# Patient Record
Sex: Female | Born: 1940 | Race: Black or African American | Hispanic: No | Smoking: Never smoker
Health system: Southern US, Community
[De-identification: ages and names within clinical notes are randomized; demographics above are authoritative.]

## PROBLEM LIST (undated history)

## (undated) ENCOUNTER — Ambulatory Visit: Discharge status: Absent without leave | Payer: Self-pay

## (undated) DIAGNOSIS — E119 Type 2 diabetes mellitus without complications: Secondary | ICD-10-CM

## (undated) DIAGNOSIS — E785 Hyperlipidemia, unspecified: Secondary | ICD-10-CM

## (undated) DIAGNOSIS — K648 Other hemorrhoids: Secondary | ICD-10-CM

## (undated) DIAGNOSIS — E559 Vitamin D deficiency, unspecified: Secondary | ICD-10-CM

## (undated) HISTORY — DX: Other hemorrhoids: K64.8

## (undated) HISTORY — DX: Vitamin D deficiency, unspecified: E55.9

## (undated) HISTORY — DX: Hyperlipidemia, unspecified: E78.5

## (undated) HISTORY — DX: Type 2 diabetes mellitus without complications: E11.9

## (undated) HISTORY — PX: TONSILLECTOMY: SUR1361

---

## 1993-12-18 HISTORY — PX: DG RIGHT TIBIA AND FIBULA (ARMC HX): HXRAD1598

## 1998-06-24 ENCOUNTER — Other Ambulatory Visit: Admission: RE | Admit: 1998-06-24 | Discharge: 1998-06-24 | Payer: Self-pay | Admitting: *Deleted

## 2000-04-26 ENCOUNTER — Other Ambulatory Visit: Admission: RE | Admit: 2000-04-26 | Discharge: 2000-04-26 | Payer: Self-pay | Admitting: *Deleted

## 2001-10-03 ENCOUNTER — Other Ambulatory Visit: Admission: RE | Admit: 2001-10-03 | Discharge: 2001-10-03 | Payer: Self-pay | Admitting: *Deleted

## 2005-03-11 ENCOUNTER — Emergency Department (HOSPITAL_COMMUNITY): Admission: EM | Admit: 2005-03-11 | Discharge: 2005-03-11 | Payer: Self-pay | Admitting: Family Medicine

## 2007-05-17 ENCOUNTER — Emergency Department (HOSPITAL_COMMUNITY): Admission: EM | Admit: 2007-05-17 | Discharge: 2007-05-17 | Payer: Self-pay | Admitting: Family Medicine

## 2008-05-29 ENCOUNTER — Ambulatory Visit: Payer: Self-pay | Admitting: Gastroenterology

## 2008-06-01 ENCOUNTER — Telehealth: Payer: Self-pay | Admitting: Gastroenterology

## 2008-06-08 ENCOUNTER — Telehealth: Payer: Self-pay | Admitting: Gastroenterology

## 2008-06-09 ENCOUNTER — Ambulatory Visit: Payer: Self-pay | Admitting: Gastroenterology

## 2008-09-07 ENCOUNTER — Emergency Department (HOSPITAL_COMMUNITY): Admission: EM | Admit: 2008-09-07 | Discharge: 2008-09-07 | Payer: Self-pay | Admitting: Family Medicine

## 2008-09-11 ENCOUNTER — Ambulatory Visit (HOSPITAL_COMMUNITY): Admission: RE | Admit: 2008-09-11 | Discharge: 2008-09-11 | Payer: Self-pay | Admitting: Internal Medicine

## 2009-11-21 IMAGING — CR DG ABDOMEN 1V
1 series · 1 of 1 positions shown · non-contrast
Comparison: None

CLINICAL DATA: Abdominal pain with nausea and vomiting.

ABDOMEN - 1 VIEW

[view not recorded]
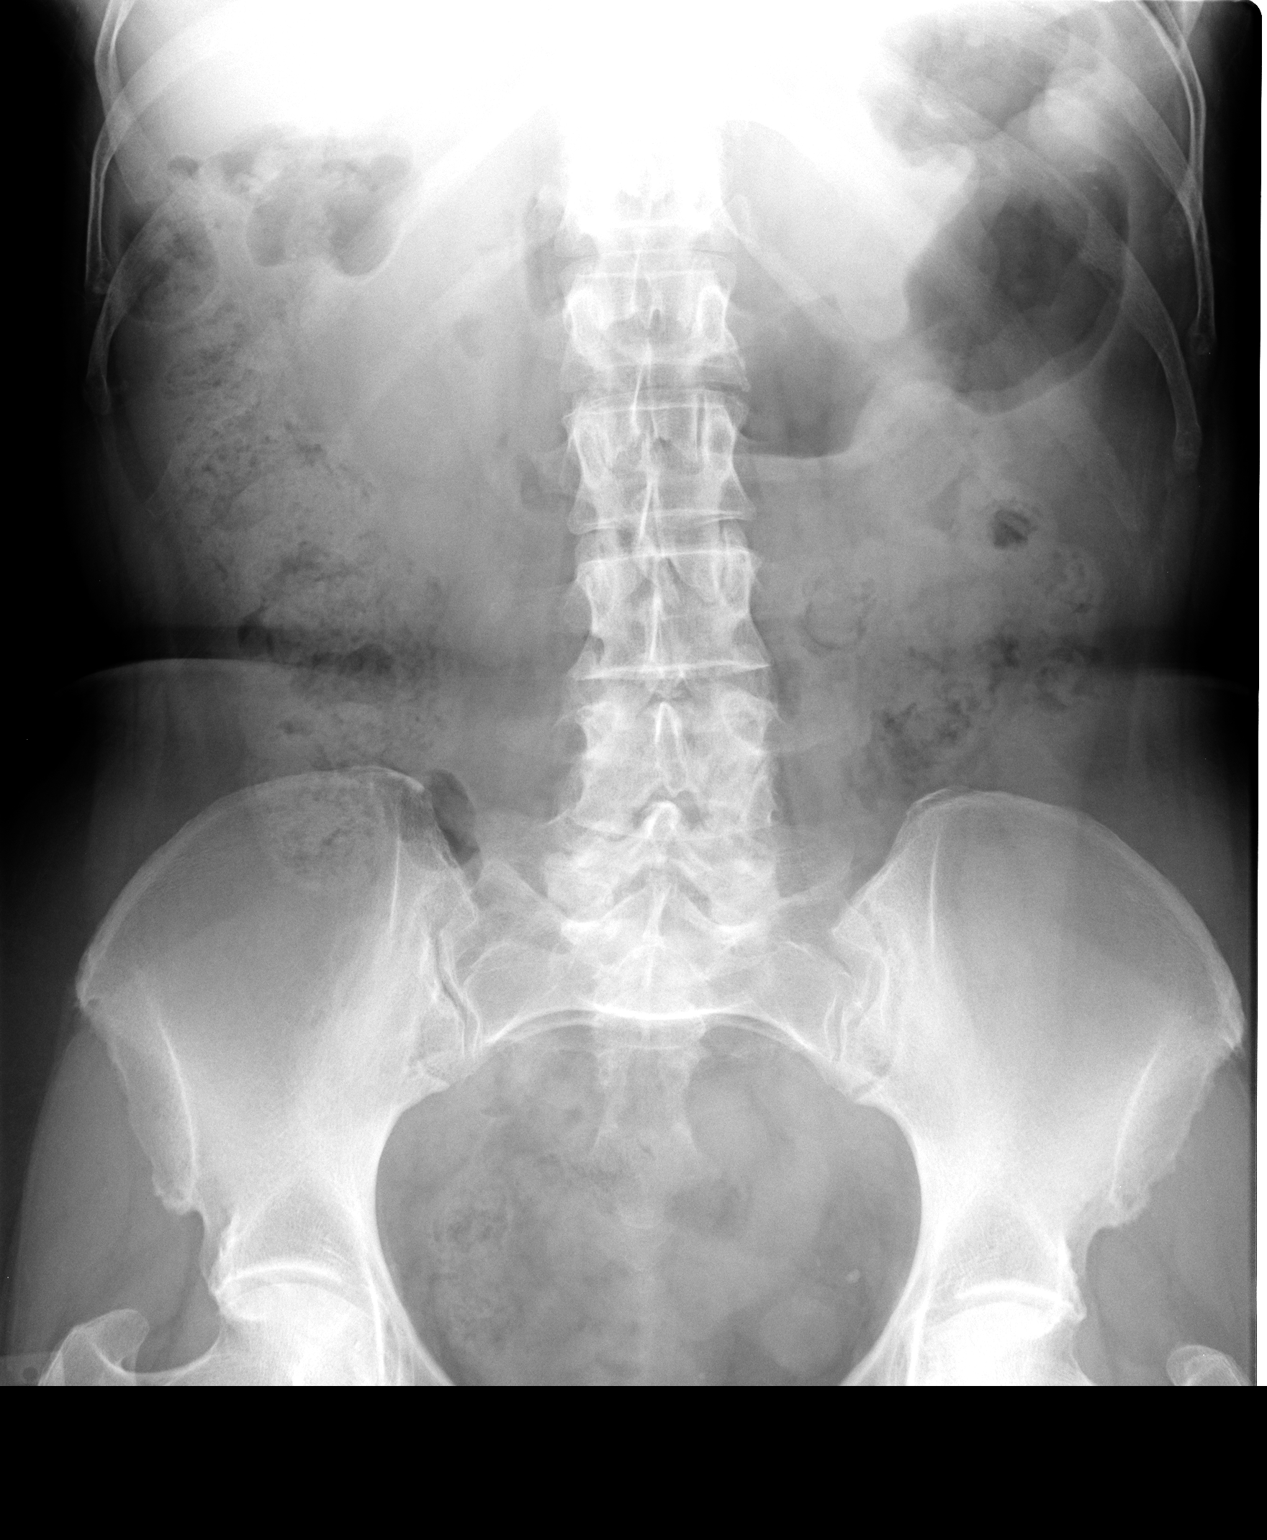

[1 of 1 positions shown; findings below may reference images not displayed]

FINDINGS: Stool is seen in the colon.  No small bowel dilatation.
Mild degenerative changes in the hips.
IMPRESSION: No acute findings.

## 2009-11-25 IMAGING — US US ABDOMEN COMPLETE
1 series · 14 of 25 positions shown · non-contrast
Comparison: None

CLINICAL DATA: Right upper quadrant pain.

ABDOMEN ULTRASOUND
TECHNIQUE: Complete abdominal ultrasound examination was performed
including evaluation of the liver, gallbladder, bile ducts,
pancreas, kidneys, spleen, IVC, and abdominal aorta.

[Series 1: unknown · 0.30mm/px · 14 of 110 slices shown]
[im 1/110]
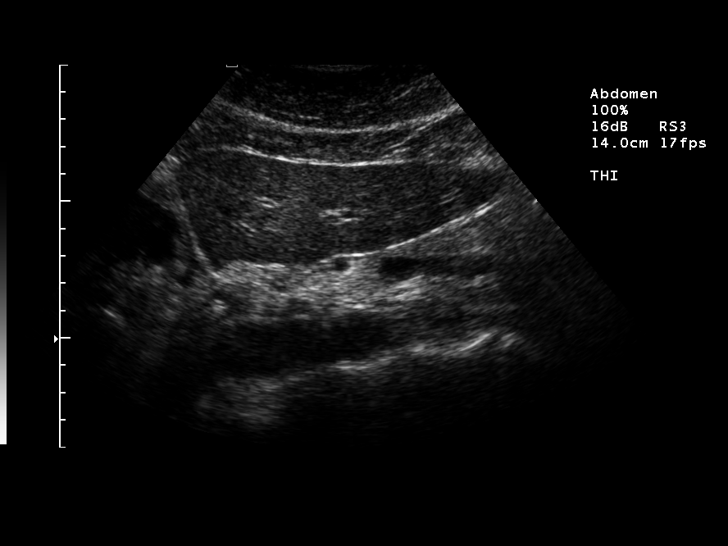
[im 10/110]
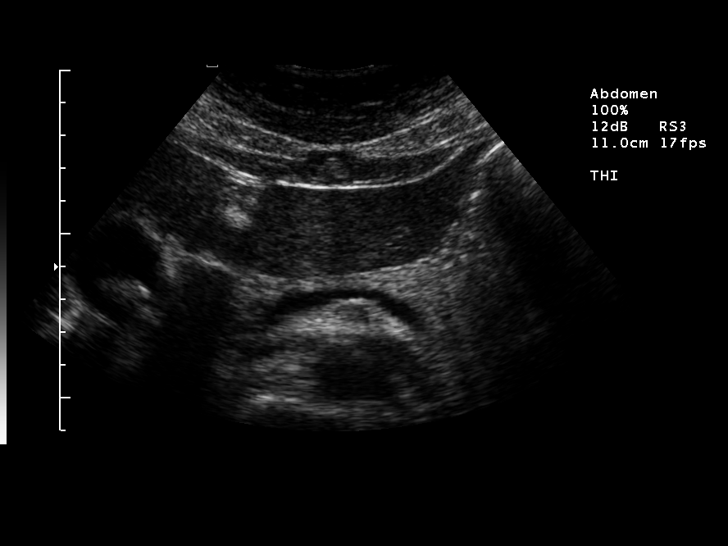
[im 19/110]
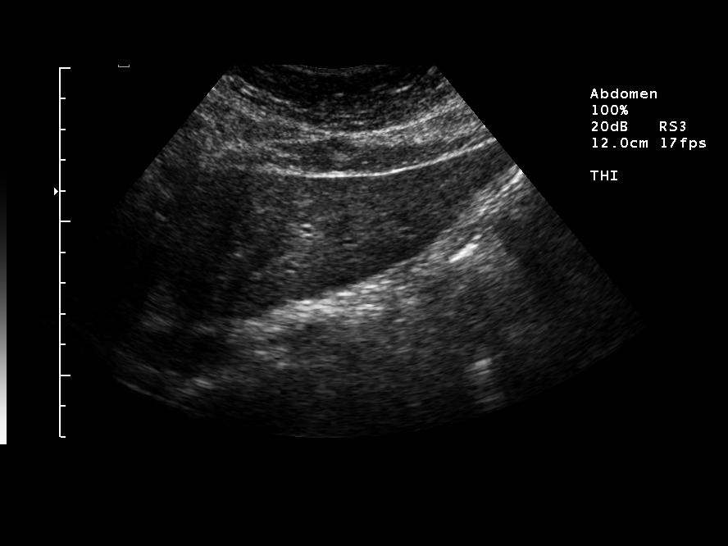
[im 28/110]
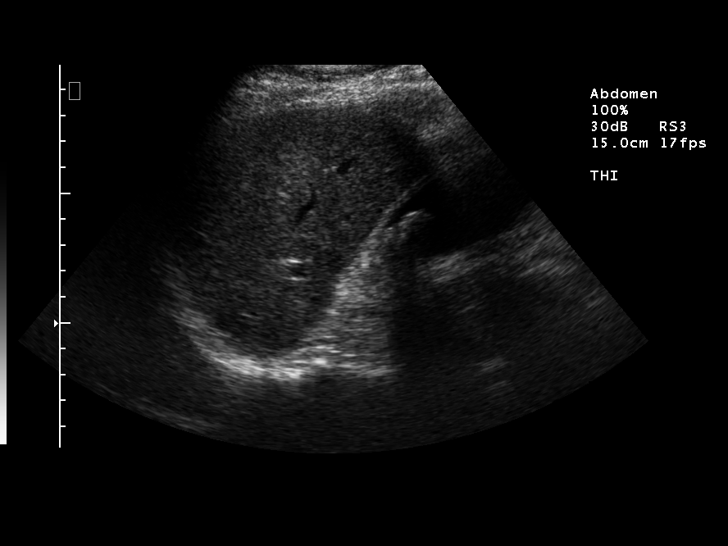
[im 37/110]
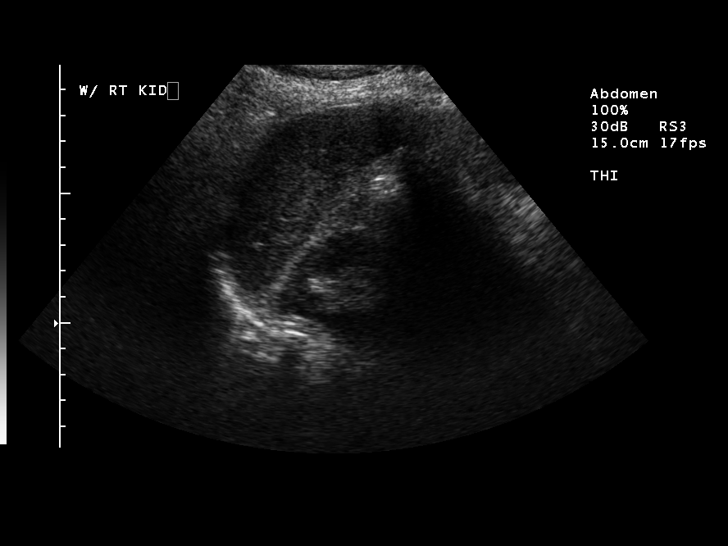
[im 41/110]
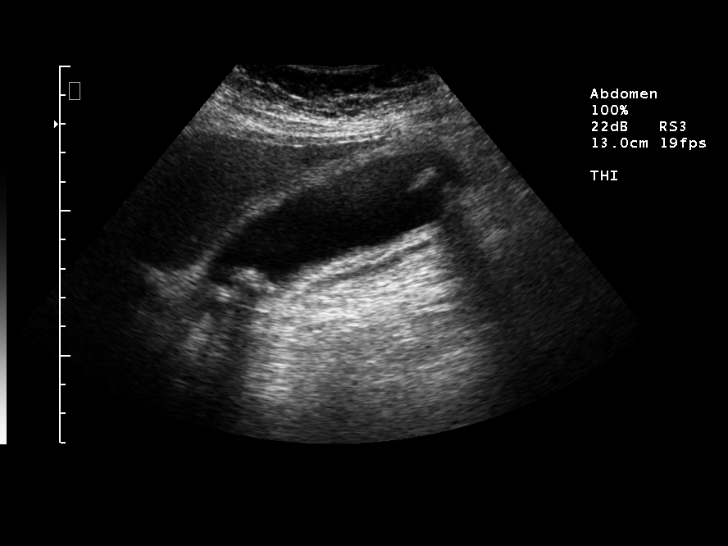
[im 50/110]
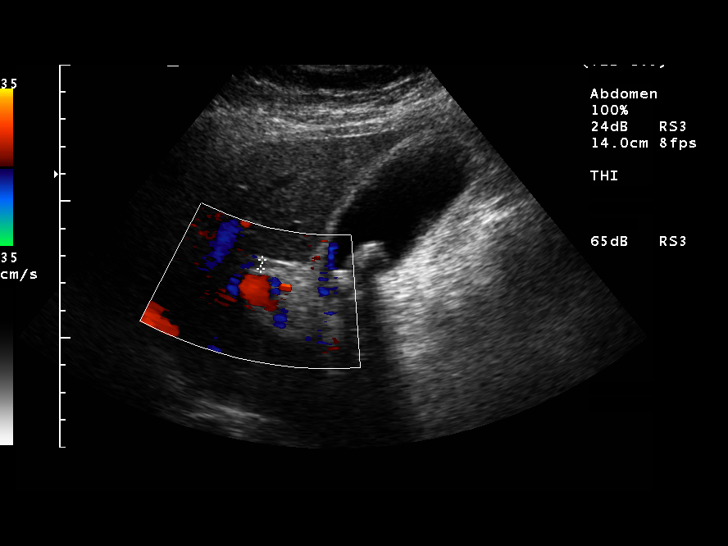
[im 60/110]
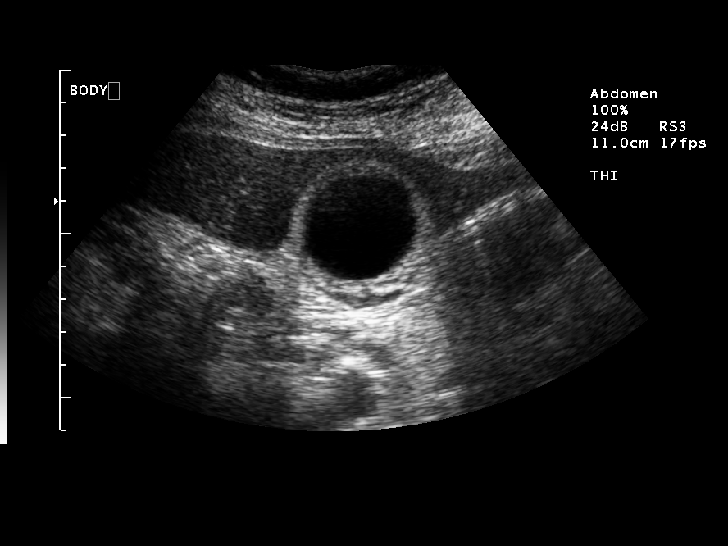
[im 69/110]
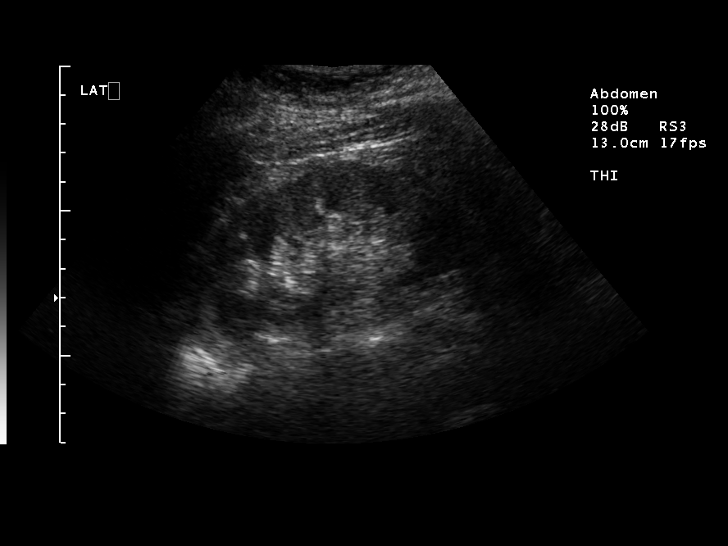
[im 73/110]
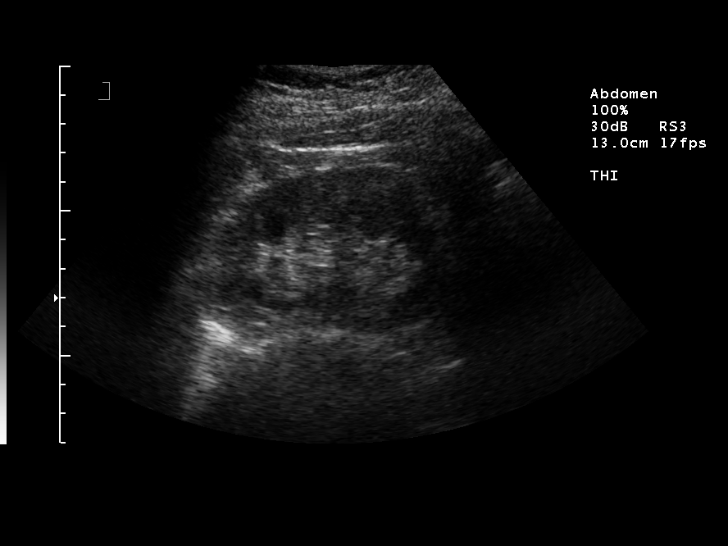
[im 82/110]
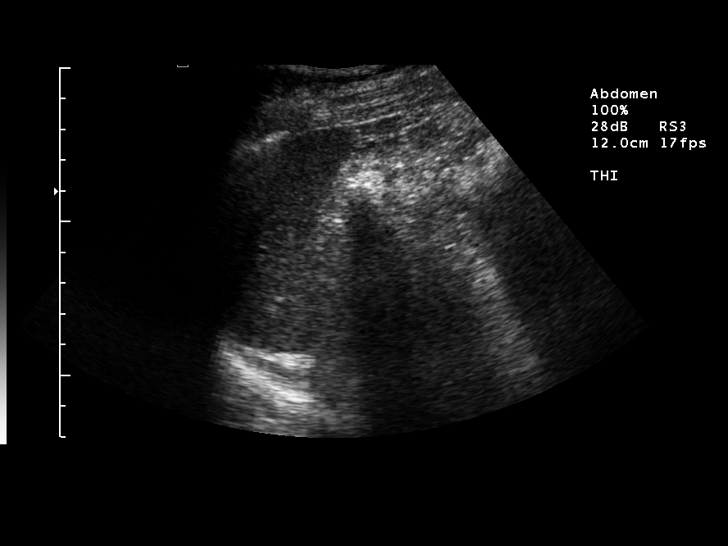
[im 91/110]
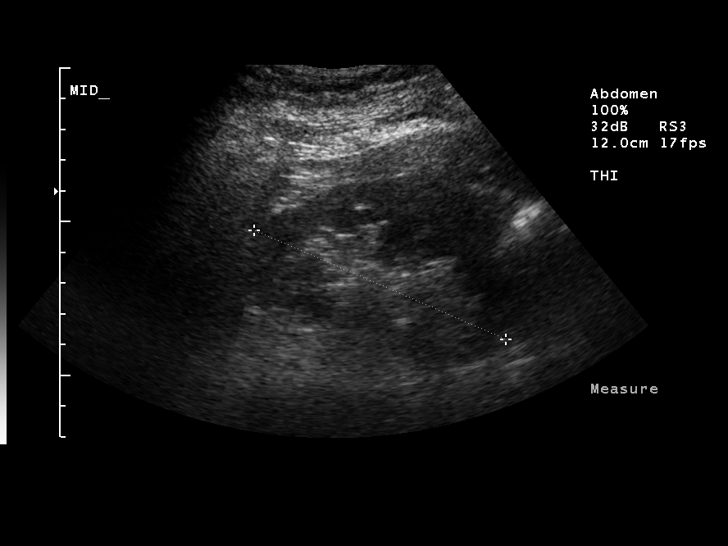
[im 100/110]
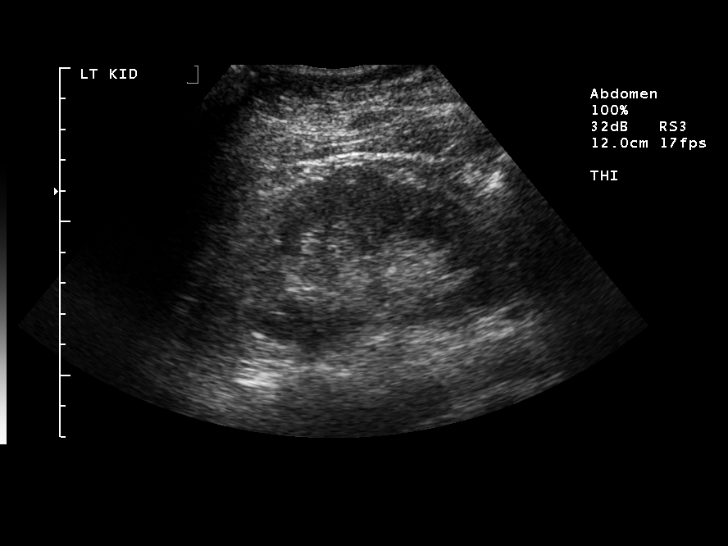
[im 110/110]
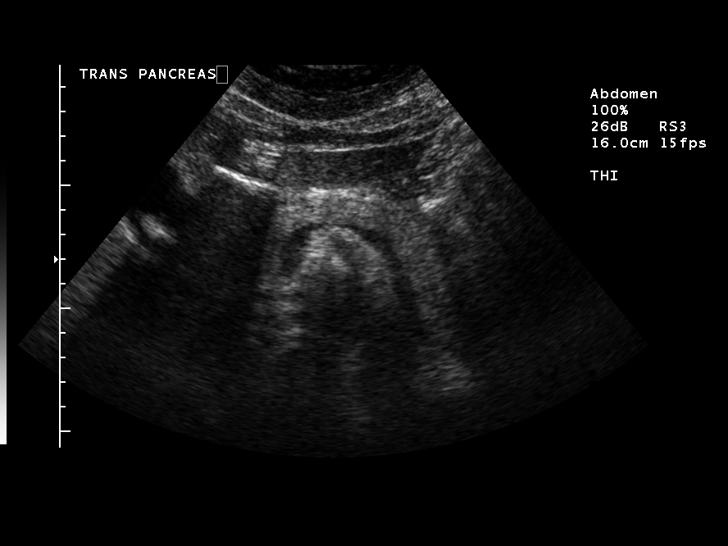

[14 of 25 positions shown; findings below may reference images not displayed]

FINDINGS: There are multiple gallstones present.  The gallbladder
wall is thickened measuring 6 mm.  The patient was  not tender
during scanning.  The largest stone measures approximately 16 mm in
diameter.  There is some pericholecystic fluid.

The common bile duct is 3.3 mm.  The liver IVC pancreas and spleen
are normal.  The kidneys and aorta normal.
IMPRESSION: Multiple gallstones.  The gallbladder is distended and the wall is
thickened.  There is pericholecystic fluid.  The findings are
concerning for acute cholecystitis.

## 2011-09-18 LAB — POCT URINALYSIS DIP (DEVICE)
Ketones, ur: 15 — AB
Protein, ur: NEGATIVE
Specific Gravity, Urine: 1.02
Urobilinogen, UA: 0.2

## 2015-11-23 ENCOUNTER — Encounter: Payer: Self-pay | Admitting: Gastroenterology

## 2016-01-27 ENCOUNTER — Ambulatory Visit: Payer: Self-pay | Admitting: Gastroenterology

## 2016-03-16 ENCOUNTER — Encounter: Payer: Self-pay | Admitting: Gastroenterology

## 2016-09-01 LAB — GLUCOSE, POCT (MANUAL RESULT ENTRY): POC Glucose: 93 mg/dl (ref 70–99)

## 2018-05-08 ENCOUNTER — Encounter: Payer: Self-pay | Admitting: Gastroenterology

## 2018-07-25 ENCOUNTER — Other Ambulatory Visit (HOSPITAL_COMMUNITY): Payer: Self-pay

## 2018-08-29 ENCOUNTER — Encounter: Payer: Self-pay | Admitting: Obstetrics and Gynecology

## 2018-10-08 ENCOUNTER — Telehealth: Payer: Self-pay | Admitting: Nurse Practitioner

## 2018-10-08 NOTE — Telephone Encounter (Signed)
I left a message letting the pt know that her 06/13/18 appt that was cancelled has been rescheduled to 10/24/18. VDM (DD)

## 2018-10-16 ENCOUNTER — Ambulatory Visit: Payer: Medicare Other

## 2018-10-16 ENCOUNTER — Encounter: Payer: Self-pay | Admitting: Nurse Practitioner

## 2018-10-16 ENCOUNTER — Ambulatory Visit: Payer: Self-pay | Admitting: Nurse Practitioner

## 2018-10-16 ENCOUNTER — Ambulatory Visit: Payer: Medicare Other | Admitting: Nurse Practitioner

## 2018-10-16 VITALS — BP 132/78 | HR 60 | Temp 97.9°F | Ht 63.0 in | Wt 138.2 lb

## 2018-10-16 VITALS — BP 132/78 | HR 60 | Temp 97.9°F | Ht 63.0 in | Wt 138.0 lb

## 2018-10-16 DIAGNOSIS — Z0000 Encounter for general adult medical examination without abnormal findings: Secondary | ICD-10-CM

## 2018-10-16 DIAGNOSIS — E782 Mixed hyperlipidemia: Secondary | ICD-10-CM

## 2018-10-16 DIAGNOSIS — R7309 Other abnormal glucose: Secondary | ICD-10-CM

## 2018-10-16 NOTE — Progress Notes (Signed)
Subjective:     Patient ID: Sabrina Rivera , female    DOB: November 11, 1941 , 78 y.o.   MRN: [id number]   Chief Complaint  Patient presents with  . Elevated A1C  . elevated lipids    HPI  Hyperlipidemia - she is taking red yeast rice.  She continues to exercise by bowling and silver sneakers.  She also walks on her own.   She had a bone density and mammogram - 08/2018 with Dr. Floydene Flock    Past Medical History:  Diagnosis Date  . Hyperlipidemia   . Internal hemorrhoids   . Type 2 diabetes mellitus (HCC)   . Vitamin D deficiency      Family History  Problem Relation Age of Onset  . Heart disease Brother   . Hypertension Father   . Lung cancer Father   . Stroke Mother   . Hypertension Mother   . Thyroid disease Mother      Current Outpatient Medications:  .  Cholecalciferol (VITAMIN D3) 1000 units CAPS, Take 1 capsule by mouth daily., Disp: , Rfl:  .  glucosamine-chondroitin 500-400 MG tablet, Take 1 tablet by mouth daily., Disp: , Rfl:  .  Multiple Vitamin (MULTIVITAMIN WITH MINERALS) TABS tablet, Take 1 tablet by mouth daily., Disp: , Rfl:  .  Red Yeast Rice 600 MG CAPS, Take 1 capsule by mouth daily., Disp: , Rfl:    Allergies  Allergen Reactions  . Morphine And Related Nausea And Vomiting     Review of Systems  Constitutional: Negative.   HENT: Negative.   Eyes: Negative.   Respiratory: Negative.   Cardiovascular: Negative.   Gastrointestinal: Negative.   Endocrine: Negative.   Genitourinary: Negative.   Musculoskeletal: Negative.   Skin: Negative.   Allergic/Immunologic: Negative.   Neurological: Negative.   Hematological: Negative.   Psychiatric/Behavioral: Negative.      Today's Vitals   10/16/18 1020  BP: 132/78  Pulse: 60  Temp: 97.9 F (36.6 C)  TempSrc: Oral  SpO2: 97%  Weight: 138 lb 3.2 oz (62.7 kg)  Height: 5\' 3"  (1.6 m)  PainSc: 0-No pain   Body mass index is 24.48 kg/m.   Objective:  Physical Exam  Constitutional: She is oriented to  person, place, and time. She appears well-developed and well-nourished.  Neck: Normal range of motion. Neck supple.  Cardiovascular: Normal rate, regular rhythm, normal heart sounds and intact distal pulses.  Pulmonary/Chest: Effort normal and breath sounds normal.  Abdominal: Soft. Bowel sounds are normal.  Musculoskeletal: Normal range of motion.  Neurological: She is alert and oriented to person, place, and time.  Skin: Skin is warm and dry. Capillary refill takes less than 2 seconds.        Assessment And Plan:      1. Routine general medical examination at a health care facility  Behavior modifications discussed and diet history reviewed.    Pt will continue to exercise regularly and modify diet with low GI, plant based foods and decrease intake of processed foods.   Recommend intake of daily multivitamin, Vitamin D, and calcium. Recommend mammogram and colonoscopy for preventive screenings, as well as recommend immunizations that include influenza, TDAP, and Shingles (declines)  2. Mixed hyperlipidemia  Chronic, fairly  Continue with current medications  3. Abnormal glucose  Chronic,   She tells me her last HgbA1c was 6 last month this is better than her last one in December 2018  Encouraged to increase her fiber intake.   Continue with current  medications      Arnette Felts, FNP

## 2018-10-16 NOTE — Progress Notes (Signed)
Subjective:   Sabrina Rivera is a 77 y.o. female who presents for Medicare Annual (Subsequent) preventive examination.  Review of Systems:  n/a Cardiac Risk Factors include: advanced age (>30men, >12 women);diabetes mellitus;dyslipidemia     Objective:     Vitals: BP 132/78   Pulse 60   Temp 97.9 F (36.6 C)   Ht 5\' 3"  (1.6 m)   Wt 138 lb (62.6 kg)   BMI 24.45 kg/m   Body mass index is 24.45 kg/m.  Advanced Directives 10/16/2018  Does Patient Have a Medical Advance Directive? No  Would patient like information on creating a medical advance directive? No - Patient declined    Tobacco Social History   Tobacco Use  Smoking Status Never Smoker  Smokeless Tobacco Never Used     Counseling given: Not Answered   Clinical Intake:  Pre-visit preparation completed: Yes  Pain : No/denies pain     Nutritional Status: BMI of 19-24  Normal Nutritional Risks: Nausea/ vomitting/ diarrhea Diabetes: Yes CBG done?: No Did pt. bring in CBG monitor from home?: No  How often do you need to have someone help you when you read instructions, pamphlets, or other written materials from your doctor or pharmacy?: 1 - Never What is the last grade level you completed in school?: some college  Interpreter Needed?: No  Information entered by :: NAllen LPN  Past Medical History:  Diagnosis Date  . Hyperlipidemia   . Internal hemorrhoids   . Type 2 diabetes mellitus (HCC)   . Vitamin D deficiency    Past Surgical History:  Procedure Laterality Date  . DG RIGHT TIBIA AND FIBULA ([facility] HX)  1995   broke leg roller skating  . TONSILLECTOMY Bilateral in second grade   Family History  Problem Relation Age of Onset  . Heart disease Brother   . Hypertension Father   . Lung cancer Father   . Stroke Mother   . Hypertension Mother   . Thyroid disease Mother    Social History   Socioeconomic History  . Marital status: Divorced    Spouse name: Not on file  . Number of children:  Not on file  . Years of education: Not on file  . Highest education level: Not on file  Occupational History  . Occupation: retired  Engineer, production  . Financial resource strain: Not hard at all  . Food insecurity:    Worry: Never true    Inability: Never true  . Transportation needs:    Medical: No    Non-medical: No  Tobacco Use  . Smoking status: Never Smoker  . Smokeless tobacco: Never Used  Substance and Sexual Activity  . Alcohol use: Never    Alcohol/week: 0.0 standard drinks    Frequency: Never  . Drug use: Never  . Sexual activity: Not Currently  Lifestyle  . Physical activity:    Days per week: 7 days    Minutes per session: 30 min  . Stress: Not at all  Relationships  . Social connections:    Talks on phone: Not on file    Gets together: Not on file    Attends religious service: Not on file    Active member of club or organization: Not on file    Attends meetings of clubs or organizations: Not on file    Relationship status: Not on file  Other Topics Concern  . Not on file  Social History Narrative  . Not on file    Outpatient  Encounter Medications as of 10/16/2018  Medication Sig  . Cholecalciferol (VITAMIN D3) 1000 units CAPS Take 1 capsule by mouth daily.  Marland Kitchen glucosamine-chondroitin 500-400 MG tablet Take 1 tablet by mouth daily.  . Multiple Vitamin (MULTIVITAMIN WITH MINERALS) TABS tablet Take 1 tablet by mouth daily.  . Red Yeast Rice 600 MG CAPS Take 1 capsule by mouth daily.   No facility-administered encounter medications on file as of 10/16/2018.     Activities of Daily Living In your present state of health, do you have any difficulty performing the following activities: 10/16/2018  Hearing? N  Vision? N  Difficulty concentrating or making decisions? Y  Comment debates on spending money  Walking or climbing stairs? N  Dressing or bathing? N  Doing errands, shopping? N  Preparing Food and eating ? N  Using the Toilet? N  In the past six  months, have you accidently leaked urine? Y  Comment uses a pad. Happens rarely  Do you have problems with loss of bowel control? N  Managing your Medications? N  Managing your Finances? N  Housekeeping or managing your Housekeeping? N  Some recent data might be hidden    Patient Care Team: Arnette Felts, FNP as PCP - General (General Practice)    Assessment:   This is a routine wellness examination for Sabrina Rivera.  Exercise Activities and Dietary recommendations Current Exercise Habits: Structured exercise class, Type of exercise: calisthenics;strength training/weights;walking, Time (Minutes): 40, Frequency (Times/Week): 7, Weekly Exercise (Minutes/Week): 280, Intensity: Moderate, Exercise limited by: None identified  Goals    . DIET - REDUCE PORTION SIZE (pt-stated)     Wants to decrease in between snacking and watch portions       Fall Risk Fall Risk  10/16/2018 10/16/2018  Falls in the past year? No No  Risk for fall due to : Medication side effect -   Is the patient's home free of loose throw rugs in walkways, pet beds, electrical cords, etc?   yes      Grab bars in the bathroom? no      Handrails on the stairs?   yes      Adequate lighting?   yes  Timed Get Up and Go performed: n/a  Depression Screen PHQ 2/9 Scores 10/16/2018 10/16/2018  PHQ - 2 Score 0 0     Cognitive Function     6CIT Screen 10/16/2018  What Year? 0 points  What month? 0 points  What time? 0 points  Count back from 20 0 points  Months in reverse 0 points  Repeat phrase 0 points  Total Score 0     There is no immunization history on file for this patient.  Qualifies for Shingles Vaccine?yes  Screening Tests Health Maintenance  Topic Date Due  . INFLUENZA VACCINE  09/18/2019 (Originally 07/18/2018)  . PNA vac Low Risk Adult (1 of 2 - PCV13) 10/17/2019 (Originally 04/19/2007)  . TETANUS/TDAP  09/02/2023  . DEXA SCAN  Completed    Cancer Screenings: Lung: Low Dose CT Chest recommended  if Age 47-80 years, 30 pack-year currently smoking OR have quit w/in 15years. Patient does not qualify. Breast:  Up to date on Mammogram? Yes   Up to date of Bone Density/Dexa? Yes Colorectal: declines  Additional Screenings: : Hepatitis C Screening: n/a     Plan:    Patient wants to decrease snacking and portion sizes.   I have personally reviewed and noted the following in the patient's chart:   . Medical and  social history . Use of alcohol, tobacco or illicit drugs  . Current medications and supplements . Functional ability and status . Nutritional status . Physical activity . Advanced directives . List of other physicians . Hospitalizations, surgeries, and ER visits in previous 12 months . Vitals . Screenings to include cognitive, depression, and falls . Referrals and appointments  In addition, I have reviewed and discussed with patient certain preventive protocols, quality metrics, and best practice recommendations. A written personalized care plan for preventive services as well as general preventive health recommendations were provided to patient.     Barb Merino, LPN  09/81/1914

## 2018-10-16 NOTE — Patient Instructions (Signed)

## 2018-10-16 NOTE — Patient Instructions (Signed)
[name] , Thank you for taking time to come for your Medicare Wellness Visit. I appreciate your ongoing commitment to your health goals. Please review the following plan we discussed and let me know if I can assist you in the future.   Screening recommendations/referrals: Colonoscopy: declined Mammogram: 09/2018 Bone Density: 09/2018 Recommended yearly ophthalmology/optometry visit for glaucoma screening and checkup Recommended yearly dental visit for hygiene and checkup  Vaccinations: Influenza vaccine: declined Pneumococcal vaccine: declined Tdap vaccine: 09/01/2013 Shingles vaccine: decline    Advanced directives: Advance directive discussed with you today. I have provided a copy for you to complete at home and have notarized. Once this is complete please bring a copy in to our office so we can scan it into your chart.   Conditions/risks identified: Patient wants to decrease portion sizes and stop snacking  Next appointment: 10/16/2018   Preventive Care 65 Years and Older, Female Preventive care refers to lifestyle choices and visits with your health care provider that can promote health and wellness. What does preventive care include?  A yearly physical exam. This is also called an annual well check.  Dental exams once or twice a year.  Routine eye exams. Ask your health care provider how often you should have your eyes checked.  Personal lifestyle choices, including:  Daily care of your teeth and gums.  Regular physical activity.  Eating a healthy diet.  Avoiding tobacco and drug use.  Limiting alcohol use.  Practicing safe sex.  Taking low-dose aspirin every day.  Taking vitamin and mineral supplements as recommended by your health care provider. What happens during an annual well check? The services and screenings done by your health care provider during your annual well check will depend on your age, overall health, lifestyle risk factors, and family  history of disease. Counseling  Your health care provider may ask you questions about your:  Alcohol use.  Tobacco use.  Drug use.  Emotional well-being.  Home and relationship well-being.  Sexual activity.  Eating habits.  History of falls.  Memory and ability to understand (cognition).  Work and work Astronomer.  Reproductive health. Screening  You may have the following tests or measurements:  Height, weight, and BMI.  Blood pressure.  Lipid and cholesterol levels. These may be checked every 5 years, or more frequently if you are over 67 years old.  Skin check.  Lung cancer screening. You may have this screening every year starting at age 40 if you have a 30-pack-year history of smoking and currently smoke or have quit within the past 15 years.  Fecal occult blood test (FOBT) of the stool. You may have this test every year starting at age 77.  Flexible sigmoidoscopy or colonoscopy. You may have a sigmoidoscopy every 5 years or a colonoscopy every 10 years starting at age 58.  Hepatitis C blood test.  Hepatitis B blood test.  Sexually transmitted disease (STD) testing.  Diabetes screening. This is done by checking your blood sugar (glucose) after you have not eaten for a while (fasting). You may have this done every 1-3 years.  Bone density scan. This is done to screen for osteoporosis. You may have this done starting at age 41.  Mammogram. This may be done every 1-2 years. Talk to your health care provider about how often you should have regular mammograms. Talk with your health care provider about your test results, treatment options, and if necessary, the need for more tests. Vaccines  Your health care provider may  recommend certain vaccines, such as:  Influenza vaccine. This is recommended every year.  Tetanus, diphtheria, and acellular pertussis (Tdap, Td) vaccine. You may need a Td booster every 10 years.  Zoster vaccine. You may need this after  age 76.  Pneumococcal 13-valent conjugate (PCV13) vaccine. One dose is recommended after age 11.  Pneumococcal polysaccharide (PPSV23) vaccine. One dose is recommended after age 109. Talk to your health care provider about which screenings and vaccines you need and how often you need them. This information is not intended to replace advice given to you by your health care provider. Make sure you discuss any questions you have with your health care provider. Document Released: 12/31/2015 Document Revised: 08/23/2016 Document Reviewed: 10/05/2015 Elsevier Interactive Patient Education  2017 [place] Prevention in the Home Falls can cause injuries. They can happen to people of all ages. There are many things you can do to make your home safe and to help prevent falls. What can I do on the outside of my home?  Regularly fix the edges of walkways and driveways and fix any cracks.  Remove anything that might make you trip as you walk through a door, such as a raised step or threshold.  Trim any bushes or trees on the path to your home.  Use bright outdoor lighting.  Clear any walking paths of anything that might make someone trip, such as rocks or tools.  Regularly check to see if handrails are loose or broken. Make sure that both sides of any steps have handrails.  Any raised decks and porches should have guardrails on the edges.  Have any leaves, snow, or ice cleared regularly.  Use sand or salt on walking paths during winter.  Clean up any spills in your garage right away. This includes oil or grease spills. What can I do in the bathroom?  Use night lights.  Install grab bars by the toilet and in the tub and shower. Do not use towel bars as grab bars.  Use non-skid mats or decals in the tub or shower.  If you need to sit down in the shower, use a plastic, non-slip stool.  Keep the floor dry. Clean up any water that spills on the floor as soon as it  happens.  Remove soap buildup in the tub or shower regularly.  Attach bath mats securely with double-sided non-slip rug tape.  Do not have throw rugs and other things on the floor that can make you trip. What can I do in the bedroom?  Use night lights.  Make sure that you have a light by your bed that is easy to reach.  Do not use any sheets or blankets that are too big for your bed. They should not hang down onto the floor.  Have a firm chair that has side arms. You can use this for support while you get dressed.  Do not have throw rugs and other things on the floor that can make you trip. What can I do in the kitchen?  Clean up any spills right away.  Avoid walking on wet floors.  Keep items that you use a lot in easy-to-reach places.  If you need to reach something above you, use a strong step stool that has a grab bar.  Keep electrical cords out of the way.  Do not use floor polish or wax that makes floors slippery. If you must use wax, use non-skid floor wax.  Do not have throw rugs and  other things on the floor that can make you trip. What can I do with my stairs?  Do not leave any items on the stairs.  Make sure that there are handrails on both sides of the stairs and use them. Fix handrails that are broken or loose. Make sure that handrails are as long as the stairways.  Check any carpeting to make sure that it is firmly attached to the stairs. Fix any carpet that is loose or worn.  Avoid having throw rugs at the top or bottom of the stairs. If you do have throw rugs, attach them to the floor with carpet tape.  Make sure that you have a light switch at the top of the stairs and the bottom of the stairs. If you do not have them, ask someone to add them for you. What else can I do to help prevent falls?  Wear shoes that:  Do not have high heels.  Have rubber bottoms.  Are comfortable and fit you well.  Are closed at the toe. Do not wear sandals.  If you  use a stepladder:  Make sure that it is fully opened. Do not climb a closed stepladder.  Make sure that both sides of the stepladder are locked into place.  Ask someone to hold it for you, if possible.  Clearly mark and make sure that you can see:  Any grab bars or handrails.  First and last steps.  Where the edge of each step is.  Use tools that help you move around (mobility aids) if they are needed. These include:  Canes.  Walkers.  Scooters.  Crutches.  Turn on the lights when you go into a dark area. Replace any light bulbs as soon as they burn out.  Set up your furniture so you have a clear path. Avoid moving your furniture around.  If any of your floors are uneven, fix them.  If there are any pets around you, be aware of where they are.  Review your medicines with your doctor. Some medicines can make you feel dizzy. This can increase your chance of falling. Ask your doctor what other things that you can do to help prevent falls. This information is not intended to replace advice given to you by your health care provider. Make sure you discuss any questions you have with your health care provider. Document Released: 09/30/2009 Document Revised: 05/11/2016 Document Reviewed: 01/08/2015 Elsevier Interactive Patient Education  2017 [employer].

## 2018-10-24 ENCOUNTER — Ambulatory Visit: Payer: Self-pay | Admitting: Internal Medicine

## 2018-10-24 ENCOUNTER — Ambulatory Visit: Payer: Self-pay

## 2018-11-05 ENCOUNTER — Encounter: Payer: Self-pay | Admitting: Nurse Practitioner

## 2019-01-01 NOTE — Addendum Note (Signed)
Addended by: Elisha Ponder E on: 01/01/2019 12:00 PM   Modules accepted: Level of Service

## 2019-02-28 ENCOUNTER — Telehealth: Payer: Self-pay | Admitting: Nurse Practitioner

## 2019-02-28 NOTE — Telephone Encounter (Signed)
Called patient to see what medication she needed refilled and she denied calling the office this week.

## 2019-04-07 ENCOUNTER — Telehealth: Payer: Self-pay

## 2019-04-07 NOTE — Telephone Encounter (Signed)
Called pt to change afternoon appt to a virtual appt, pt declined virtual visit, pt also declined being scheduled to come into the office. Stated she will call the office back to reschedule her appt

## 2019-04-17 ENCOUNTER — Ambulatory Visit: Payer: Self-pay | Admitting: Nurse Practitioner

## 2019-06-19 ENCOUNTER — Telehealth: Payer: Self-pay

## 2019-06-19 NOTE — Telephone Encounter (Signed)
This nurse called patient in order to make appointment for AWV. She stated that she would have to look at her schedule and call back. Did not have any availability at this time.

## 2019-07-15 ENCOUNTER — Other Ambulatory Visit: Payer: Self-pay

## 2019-07-15 ENCOUNTER — Ambulatory Visit (INDEPENDENT_AMBULATORY_CARE_PROVIDER_SITE_OTHER): Payer: Medicare Other

## 2019-07-15 VITALS — Ht 64.0 in | Wt 133.0 lb

## 2019-07-15 DIAGNOSIS — Z0000 Encounter for general adult medical examination without abnormal findings: Secondary | ICD-10-CM | POA: Diagnosis not present

## 2019-07-15 NOTE — Patient Instructions (Signed)
Sabrina Rivera , Thank you for taking time to come for your Medicare Wellness Visit. I appreciate your ongoing commitment to your health goals. Please review the following plan we discussed and let me know if I can assist you in the future.   Screening recommendations/referrals: Colonoscopy: not required Mammogram: not required Bone Density: 09/2018 Recommended yearly ophthalmology/optometry visit for glaucoma screening and checkup Recommended yearly dental visit for hygiene and checkup  Vaccinations: Influenza vaccine: decline Pneumococcal vaccine: decline Tdap vaccine: 08/2013 Shingles vaccine: discussed    Advanced directives: Advance directive discussed with you today.   Conditions/risks identified: none  Next appointment:    Preventive Care 45 Years and Older, Female Preventive care refers to lifestyle choices and visits with your health care provider that can promote health and wellness. What does preventive care include?  A yearly physical exam. This is also called an annual well check.  Dental exams once or twice a year.  Routine eye exams. Ask your health care provider how often you should have your eyes checked.  Personal lifestyle choices, including:  Daily care of your teeth and gums.  Regular physical activity.  Eating a healthy diet.  Avoiding tobacco and drug use.  Limiting alcohol use.  Practicing safe sex.  Taking low-dose aspirin every day.  Taking vitamin and mineral supplements as recommended by your health care provider. What happens during an annual well check? The services and screenings done by your health care provider during your annual well check will depend on your age, overall health, lifestyle risk factors, and family history of disease. Counseling  Your health care provider may ask you questions about your:  Alcohol use.  Tobacco use.  Drug use.  Emotional well-being.  Home and relationship well-being.  Sexual activity.   Eating habits.  History of falls.  Memory and ability to understand (cognition).  Work and work Statistician.  Reproductive health. Screening  You may have the following tests or measurements:  Height, weight, and BMI.  Blood pressure.  Lipid and cholesterol levels. These may be checked every 5 years, or more frequently if you are over 75 years old.  Skin check.  Lung cancer screening. You may have this screening every year starting at age 18 if you have a 30-pack-year history of smoking and currently smoke or have quit within the past 15 years.  Fecal occult blood test (FOBT) of the stool. You may have this test every year starting at age 30.  Flexible sigmoidoscopy or colonoscopy. You may have a sigmoidoscopy every 5 years or a colonoscopy every 10 years starting at age 4.  Hepatitis C blood test.  Hepatitis B blood test.  Sexually transmitted disease (STD) testing.  Diabetes screening. This is done by checking your blood sugar (glucose) after you have not eaten for a while (fasting). You may have this done every 1-3 years.  Bone density scan. This is done to screen for osteoporosis. You may have this done starting at age 56.  Mammogram. This may be done every 1-2 years. Talk to your health care provider about how often you should have regular mammograms. Talk with your health care provider about your test results, treatment options, and if necessary, the need for more tests. Vaccines  Your health care provider may recommend certain vaccines, such as:  Influenza vaccine. This is recommended every year.  Tetanus, diphtheria, and acellular pertussis (Tdap, Td) vaccine. You may need a Td booster every 10 years.  Zoster vaccine. You may need this after age  60.  Pneumococcal 13-valent conjugate (PCV13) vaccine. One dose is recommended after age 64.  Pneumococcal polysaccharide (PPSV23) vaccine. One dose is recommended after age 43. Talk to your health care provider  about which screenings and vaccines you need and how often you need them. This information is not intended to replace advice given to you by your health care provider. Make sure you discuss any questions you have with your health care provider. Document Released: 12/31/2015 Document Revised: 08/23/2016 Document Reviewed: 10/05/2015 Elsevier Interactive Patient Education  2017 [place] Prevention in the Home Falls can cause injuries. They can happen to people of all ages. There are many things you can do to make your home safe and to help prevent falls. What can I do on the outside of my home?  Regularly fix the edges of walkways and driveways and fix any cracks.  Remove anything that might make you trip as you walk through a door, such as a raised step or threshold.  Trim any bushes or trees on the path to your home.  Use bright outdoor lighting.  Clear any walking paths of anything that might make someone trip, such as rocks or tools.  Regularly check to see if handrails are loose or broken. Make sure that both sides of any steps have handrails.  Any raised decks and porches should have guardrails on the edges.  Have any leaves, snow, or ice cleared regularly.  Use sand or salt on walking paths during winter.  Clean up any spills in your garage right away. This includes oil or grease spills. What can I do in the bathroom?  Use night lights.  Install grab bars by the toilet and in the tub and shower. Do not use towel bars as grab bars.  Use non-skid mats or decals in the tub or shower.  If you need to sit down in the shower, use a plastic, non-slip stool.  Keep the floor dry. Clean up any water that spills on the floor as soon as it happens.  Remove soap buildup in the tub or shower regularly.  Attach bath mats securely with double-sided non-slip rug tape.  Do not have throw rugs and other things on the floor that can make you trip. What can I do in the  bedroom?  Use night lights.  Make sure that you have a light by your bed that is easy to reach.  Do not use any sheets or blankets that are too big for your bed. They should not hang down onto the floor.  Have a firm chair that has side arms. You can use this for support while you get dressed.  Do not have throw rugs and other things on the floor that can make you trip. What can I do in the kitchen?  Clean up any spills right away.  Avoid walking on wet floors.  Keep items that you use a lot in easy-to-reach places.  If you need to reach something above you, use a strong step stool that has a grab bar.  Keep electrical cords out of the way.  Do not use floor polish or wax that makes floors slippery. If you must use wax, use non-skid floor wax.  Do not have throw rugs and other things on the floor that can make you trip. What can I do with my stairs?  Do not leave any items on the stairs.  Make sure that there are handrails on both sides of the stairs and  use them. Fix handrails that are broken or loose. Make sure that handrails are as long as the stairways.  Check any carpeting to make sure that it is firmly attached to the stairs. Fix any carpet that is loose or worn.  Avoid having throw rugs at the top or bottom of the stairs. If you do have throw rugs, attach them to the floor with carpet tape.  Make sure that you have a light switch at the top of the stairs and the bottom of the stairs. If you do not have them, ask someone to add them for you. What else can I do to help prevent falls?  Wear shoes that:  Do not have high heels.  Have rubber bottoms.  Are comfortable and fit you well.  Are closed at the toe. Do not wear sandals.  If you use a stepladder:  Make sure that it is fully opened. Do not climb a closed stepladder.  Make sure that both sides of the stepladder are locked into place.  Ask someone to hold it for you, if possible.  Clearly mark and make  sure that you can see:  Any grab bars or handrails.  First and last steps.  Where the edge of each step is.  Use tools that help you move around (mobility aids) if they are needed. These include:  Canes.  Walkers.  Scooters.  Crutches.  Turn on the lights when you go into a dark area. Replace any light bulbs as soon as they burn out.  Set up your furniture so you have a clear path. Avoid moving your furniture around.  If any of your floors are uneven, fix them.  If there are any pets around you, be aware of where they are.  Review your medicines with your doctor. Some medicines can make you feel dizzy. This can increase your chance of falling. Ask your doctor what other things that you can do to help prevent falls. This information is not intended to replace advice given to you by your health care provider. Make sure you discuss any questions you have with your health care provider. Document Released: 09/30/2009 Document Revised: 05/11/2016 Document Reviewed: 01/08/2015 Elsevier Interactive Patient Education  2017 [employer].

## 2019-07-15 NOTE — Progress Notes (Signed)
Subjective:   Sabrina Rivera is a 78 y.o. female who presents for Medicare Annual (Subsequent) preventive examination.  This visit type was conducted due to national recommendations for restrictions regarding the COVID-19 Pandemic (e.g. social distancing). This format is felt to be most appropriate for this patient at this time. All issues noted in this document were discussed and addressed. No physical exam was performed (except for noted visual exam findings with Video Visits). This patient, Sabrina Rivera, has given permission to perform this visit via telephone. Vital signs may be absent or patient reported.  Patient location:  At home  Nurse location:  [facility] office    Review of Systems:  n/a Cardiac Risk Factors include: advanced age (>955men, 61>65 women)     Objective:     Vitals: Ht 5\' 4"  (1.626 m) Comment: per patient  Wt 133 lb (60.3 kg) Comment: per patient  BMI 22.83 kg/m   Body mass index is 22.83 kg/m.  Advanced Directives 07/15/2019 10/16/2018  Does Patient Have a Medical Advance Directive? No No  Would patient like information on creating a medical advance directive? 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 Hyperlipidemia   . Internal hemorrhoids   . Type 2 diabetes mellitus (HCC)   . Vitamin D deficiency    Past Surgical History:   Procedure Laterality Date  . DG RIGHT TIBIA AND FIBULA ([facility] HX)  1995   broke leg roller skating  . TONSILLECTOMY Bilateral in second grade   Family History  Problem Relation Age of Onset  . Heart disease Brother   . Hypertension Father   . Lung cancer Father   . Stroke Mother   . Hypertension Mother   . Thyroid disease Mother    Social History   Socioeconomic History  . Marital status: Divorced    Spouse name: Not on file  . Number of children: Not on file  . Years of education: Not on file  . Highest education level: Not on file  Occupational History  . Occupation: retired  Engineer, productionocial Needs  . Financial resource strain: Not hard at all  . Food insecurity    Worry: Never true    Inability: Never true  . Transportation needs    Medical: No    Non-medical: No  Tobacco Use  . Smoking status: Never Smoker  . Smokeless tobacco: Never Used  Substance and Sexual Activity  . Alcohol use: Never    Alcohol/week: 0.0 standard drinks    Frequency: Never  . Drug use: Never  . Sexual activity: Not Currently  Lifestyle  . Physical activity    Days per week: 3 days    Minutes per session: 30 min  . Stress: Not at all  Relationships  . Social connections    Talks on phone: Not on file  Gets together: Not on file    Attends religious service: Not on file    Active member of club or organization: Not on file    Attends meetings of clubs or organizations: Not on file    Relationship status: Not on file  Other Topics Concern  . Not on file  Social History Narrative  . Not on file    Outpatient Encounter Medications as of 07/15/2019  Medication Sig  . Cholecalciferol (VITAMIN D3) 1000 units CAPS Take 1 capsule by mouth daily.  Marland Kitchen co-enzyme Q-10 30 MG capsule Take 30 mg by mouth daily.  . Garlic 062 MG TABS Take 1 tablet by mouth.  Marland Kitchen glucosamine-chondroitin 500-400 MG tablet Take 1 tablet by mouth daily.  Marland Kitchen lactobacillus acidophilus (BACID) TABS tablet Take 1 tablet by mouth  3 (three) times daily.  . Multiple Vitamin (MULTIVITAMIN WITH MINERALS) TABS tablet Take 1 tablet by mouth daily.  . Red Yeast Rice 600 MG CAPS Take 1 capsule by mouth daily.   No facility-administered encounter medications on file as of 07/15/2019.     Activities of Daily Living In your present state of health, do you have any difficulty performing the following activities: 07/15/2019 10/16/2018  Hearing? N N  Vision? N N  Difficulty concentrating or making decisions? N Y  Comment - debates on spending money  Walking or climbing stairs? N N  Dressing or bathing? N N  Doing errands, shopping? N N  Preparing Food and eating ? N N  Using the Toilet? N N  In the past six months, have you accidently leaked urine? Tempie Donning  Comment very tiny bit uses a pad. Happens rarely  Do you have problems with loss of bowel control? N N  Managing your Medications? N N  Managing your Finances? N N  Housekeeping or managing your Housekeeping? N N  Some recent data might be hidden    Patient Care Team: Minette Brine, FNP as PCP - General (General Practice)    Assessment:   This is a routine wellness examination for Nomie.  Exercise Activities and Dietary recommendations Current Exercise Habits: Home exercise routine, Type of exercise: walking, Time (Minutes): 30, Frequency (Times/Week): 3, Weekly Exercise (Minutes/Week): 90  Goals    . DIET - REDUCE PORTION SIZE (pt-stated)     Wants to decrease in between snacking and watch portions    . Patient Stated     07/15/2019 wants to watch portions and decrease sweet intake       Fall Risk Fall Risk  07/15/2019 10/16/2018 10/16/2018  Falls in the past year? 0 No No  Risk for fall due to : - Medication side effect -  Follow up Falls evaluation completed;Falls prevention discussed - -   Is the patient's home free of loose throw rugs in walkways, pet beds, electrical cords, etc?   yes      Grab bars in the bathroom? no      Handrails on the stairs?    yes      Adequate lighting?   yes  Timed Get Up and Go performed: n/a  Depression Screen PHQ 2/9 Scores 07/15/2019 10/16/2018 10/16/2018  PHQ - 2 Score 0 0 0  PHQ- 9 Score 0 - -     Cognitive Function     6CIT Screen 07/15/2019 10/16/2018  What Year? 0 points 0 points  What month? 0 points 0 points  What time? 0 points 0 points  Count back from 20 0 points 0  points  Months in reverse 0 points 0 points  Repeat phrase 0 points 0 points  Total Score 0 0     There is no immunization history on file for this patient.  Qualifies for Shingles Vaccine? yes  Screening Tests Health Maintenance  Topic Date Due  . PNA vac Low Risk Adult (1 of 2 - PCV13) 10/17/2019 (Originally 04/19/2007)  . INFLUENZA VACCINE  07/19/2019  . TETANUS/TDAP  09/02/2023  . DEXA SCAN  Completed    Cancer Screenings: Lung: Low Dose CT Chest recommended if Age 11-80 years, 30 pack-year currently smoking OR have quit w/in 15years. Patient does not qualify. Breast:  Up to date on Mammogram? Yes   Up to date of Bone Density/Dexa? Yes Colorectal: not required  Additional Screenings: : Hepatitis C Screening: n/a     Plan:   6 CIT was 0.  Patient wants to watch portions and decrease sweets intake.   I have personally reviewed and noted the following in the patient's chart:   . Medical and social history . Use of alcohol, tobacco or illicit drugs  . Current medications and supplements . Functional ability and status . Nutritional status . Physical activity . Advanced directives . List of other physicians . Hospitalizations, surgeries, and ER visits in previous 12 months . Vitals . Screenings to include cognitive, depression, and falls . Referrals and appointments  In addition, I have reviewed and discussed with patient certain preventive protocols, quality metrics, and best practice recommendations. A written personalized care plan for preventive services as well as general preventive health  recommendations were provided to patient.     Barb Merinoickeah E Kristy Schomburg, LPN  [id number]/2020

## 2019-09-09 ENCOUNTER — Other Ambulatory Visit: Payer: Self-pay

## 2019-09-09 ENCOUNTER — Encounter: Payer: Self-pay | Admitting: Nurse Practitioner

## 2019-09-09 ENCOUNTER — Ambulatory Visit (INDEPENDENT_AMBULATORY_CARE_PROVIDER_SITE_OTHER): Payer: Medicare Other | Admitting: Nurse Practitioner

## 2019-09-09 VITALS — BP 110/70 | HR 59 | Temp 98.6°F | Ht 64.0 in | Wt 137.4 lb

## 2019-09-09 DIAGNOSIS — E782 Mixed hyperlipidemia: Secondary | ICD-10-CM | POA: Diagnosis not present

## 2019-09-09 DIAGNOSIS — E559 Vitamin D deficiency, unspecified: Secondary | ICD-10-CM

## 2019-09-09 DIAGNOSIS — R7309 Other abnormal glucose: Secondary | ICD-10-CM | POA: Diagnosis not present

## 2019-09-09 DIAGNOSIS — Z0000 Encounter for general adult medical examination without abnormal findings: Secondary | ICD-10-CM | POA: Diagnosis not present

## 2019-09-09 LAB — POCT URINALYSIS DIPSTICK
Bilirubin, UA: NEGATIVE
Glucose, UA: NEGATIVE
Ketones, UA: NEGATIVE
Nitrite, UA: NEGATIVE
Protein, UA: NEGATIVE
Spec Grav, UA: 1.015 (ref 1.010–1.025)
Urobilinogen, UA: 0.2 E.U./dL
pH, UA: 6.5 (ref 5.0–8.0)

## 2019-09-09 NOTE — Patient Instructions (Signed)
Health Maintenance  Topic Date Due  . PNA vac Low Risk Adult (1 of 2 - PCV13) 10/17/2019 (Originally 04/19/2007)  . INFLUENZA VACCINE  03/17/2020 (Originally 07/19/2019)  . TETANUS/TDAP  09/02/2023  . DEXA SCAN  Completed   Health Maintenance After Age 77 After age 80, you are at a higher risk for certain long-term diseases and infections as well as injuries from falls. Falls are a major cause of broken bones and head injuries in people who are older than age 70. Getting regular preventive care can help to keep you healthy and well. Preventive care includes getting regular testing and making lifestyle changes as recommended by your health care provider. Talk with your health care provider about:  Which screenings and tests you should have. A screening is a test that checks for a disease when you have no symptoms.  A diet and exercise plan that is right for you. What should I know about screenings and tests to prevent falls? Screening and testing are the best ways to find a health problem early. Early diagnosis and treatment give you the best chance of managing medical conditions that are common after age 66. Certain conditions and lifestyle choices may make you more likely to have a fall. Your health care provider may recommend:  Regular vision checks. Poor vision and conditions such as cataracts can make you more likely to have a fall. If you wear glasses, make sure to get your prescription updated if your vision changes.  Medicine review. Work with your health care provider to regularly review all of the medicines you are taking, including over-the-counter medicines. Ask your health care provider about any side effects that may make you more likely to have a fall. Tell your health care provider if any medicines that you take make you feel dizzy or sleepy.  Osteoporosis screening. Osteoporosis is a condition that causes the bones to get weaker. This can make the bones weak and cause them to break  more easily.  Blood pressure screening. Blood pressure changes and medicines to control blood pressure can make you feel dizzy.  Strength and balance checks. Your health care provider may recommend certain tests to check your strength and balance while standing, walking, or changing positions.  Foot health exam. Foot pain and numbness, as well as not wearing proper footwear, can make you more likely to have a fall.  Depression screening. You may be more likely to have a fall if you have a fear of falling, feel emotionally low, or feel unable to do activities that you used to do.  Alcohol use screening. Using too much alcohol can affect your balance and may make you more likely to have a fall. What actions can I take to lower my risk of falls? General instructions  Talk with your health care provider about your risks for falling. Tell your health care provider if: ? You fall. Be sure to tell your health care provider about all falls, even ones that seem minor. ? You feel dizzy, sleepy, or off-balance.  Take over-the-counter and prescription medicines only as told by your health care provider. These include any supplements.  Eat a healthy diet and maintain a healthy weight. A healthy diet includes low-fat dairy products, low-fat (lean) meats, and fiber from whole grains, beans, and lots of fruits and vegetables. Home safety  Remove any tripping hazards, such as rugs, cords, and clutter.  Install safety equipment such as grab bars in bathrooms and safety rails on stairs.  Keep  rooms and walkways well-lit. Activity   Follow a regular exercise program to stay fit. This will help you maintain your balance. Ask your health care provider what types of exercise are appropriate for you.  If you need a cane or walker, use it as recommended by your health care provider.  Wear supportive shoes that have nonskid soles. Lifestyle  Do not drink alcohol if your health care provider tells you not  to drink.  If you drink alcohol, limit how much you have: ? 0-1 drink a day for women. ? 0-2 drinks a day for men.  Be aware of how much alcohol is in your drink. In the [place] one drink equals one typical bottle of beer (12 oz), one-half glass of wine (5 oz), or one shot of hard liquor (1 oz).  Do not use any products that contain nicotine or tobacco, such as cigarettes and e-cigarettes. If you need help quitting, ask your health care provider. Summary  Having a healthy lifestyle and getting preventive care can help to protect your health and wellness after age 62.  Screening and testing are the best way to find a health problem early and help you avoid having a fall. Early diagnosis and treatment give you the best chance for managing medical conditions that are more common for people who are older than age 54.  Falls are a major cause of broken bones and head injuries in people who are older than age 68. Take precautions to prevent a fall at home.  Work with your health care provider to learn what changes you can make to improve your health and wellness and to prevent falls. This information is not intended to replace advice given to you by your health care provider. Make sure you discuss any questions you have with your health care provider. Document Released: 10/17/2017 Document Revised: 03/27/2019 Document Reviewed: 10/17/2017 Elsevier Patient Education  2020 [employer].

## 2019-09-09 NOTE — Progress Notes (Signed)
Subjective:     Patient ID: Sabrina Rivera , female    DOB: 1941/09/10 , 78 y.o.   MRN: [id number]   Chief Complaint  Patient presents with  . Annual Exam    HPI  HPI  The patient states she uses post menopausal status for birth control.  No LMP recorded. Patient is postmenopausal.  No longer getting mammograms. Negative for: breast discharge, breast lump(s), breast pain and breast self exam.  Pertinent negatives include  anxiety, decreased libido, depression, difficulty falling sleep, dyspareunia, history of infertility, nocturia, sexual dysfunction, sleep disturbances, urinary incontinence, urinary urgency, vaginal discharge and vaginal itching. Diet regular.The patient states her exercise level is  moderate - walking at home and down to [facility].     The patient's tobacco use is:  Social History   Tobacco Use  Smoking Status Never Smoker  Smokeless Tobacco Never Used   She has been exposed to passive smoke. The patient's alcohol use is:  Social History   Substance and Sexual Activity  Alcohol Use Never  . Alcohol/week: 0.0 standard drinks  . Frequency: Never    Past Medical History:  Diagnosis Date  . Hyperlipidemia   . Internal hemorrhoids   . Type 2 diabetes mellitus ([place])   . Vitamin D deficiency      Family History  Problem Relation Age of Onset  . Heart disease Brother   . Hypertension Father   . Lung cancer Father   . Stroke Mother   . Hypertension Mother   . Thyroid disease Mother      Current Outpatient Medications:  .  Cholecalciferol (VITAMIN D3) 1000 units CAPS, Take 1 capsule by mouth daily., Disp: , Rfl:  .  co-enzyme Q-10 30 MG capsule, Take 30 mg by mouth daily., Disp: , Rfl:  .  fluorometholone (FML) 0.1 % ophthalmic suspension, Place 1 drop into both eyes 4 (four) times daily., Disp: , Rfl:  .  Garlic 629 MG TABS, Take 1 tablet by mouth., Disp: , Rfl:  .  glucosamine-chondroitin 500-400 MG tablet, Take 1 tablet by mouth daily., Disp: , Rfl:   .  lactobacillus acidophilus (BACID) TABS tablet, Take 1 tablet by mouth 3 (three) times daily., Disp: , Rfl:  .  Multiple Vitamin (MULTIVITAMIN WITH MINERALS) TABS tablet, Take 1 tablet by mouth daily., Disp: , Rfl:  .  Polyethyl Glycol-Propyl Glycol (SYSTANE OP), Apply 1 drop to eye as needed., Disp: , Rfl:  .  Red Yeast Rice 600 MG CAPS, Take 1 capsule by mouth daily., Disp: , Rfl:    Allergies  Allergen Reactions  . Morphine And Related Nausea And Vomiting     Review of Systems  Constitutional: Negative.   HENT: Negative.   Eyes: Negative.   Respiratory: Negative.   Cardiovascular: Negative.  Negative for chest pain, palpitations and leg swelling.  Gastrointestinal: Negative.   Endocrine: Negative.   Genitourinary: Negative.   Musculoskeletal:       Left shoulder discomfort at times with range of motion.  Skin: Negative.   Allergic/Immunologic: Negative.   Neurological: Negative.  Negative for dizziness and headaches.  Hematological: Negative.   Psychiatric/Behavioral: Negative.      Today's Vitals   09/09/19 1005  BP: 110/70  Pulse: (!) 59  Temp: 98.6 F (37 C)  TempSrc: Oral  Weight: 137 lb 6.4 oz (62.3 kg)  Height: 5\' 4"  (1.626 m)  PainSc: 0-No pain   Body mass index is 23.58 kg/m.   Objective:  Physical Exam  Constitutional:      Appearance: Normal appearance. She is well-developed.  HENT:     Head: Normocephalic and atraumatic.     Right Ear: Hearing, tympanic membrane, ear canal and external ear normal.     Left Ear: Hearing, tympanic membrane, ear canal and external ear normal.  Eyes:     General: Lids are normal.     Conjunctiva/sclera: Conjunctivae normal.     Pupils: Pupils are equal, round, and reactive to light.     Funduscopic exam:    Right eye: No papilledema.        Left eye: No papilledema.  Neck:     Musculoskeletal: Full passive range of motion without pain, normal range of motion and neck supple.     Thyroid: No thyroid mass.      Vascular: No carotid bruit.  Cardiovascular:     Rate and Rhythm: Normal rate and regular rhythm.     Pulses: Normal pulses.     Heart sounds: Normal heart sounds. No murmur.  Pulmonary:     Effort: Pulmonary effort is normal.     Breath sounds: Normal breath sounds.  Abdominal:     General: Abdomen is flat. Bowel sounds are normal.     Palpations: Abdomen is soft.  Musculoskeletal: Normal range of motion.        General: No swelling.     Right lower leg: No edema.     Left lower leg: No edema.  Skin:    General: Skin is warm and dry.     Capillary Refill: Capillary refill takes less than 2 seconds.  Neurological:     General: No focal deficit present.     Mental Status: She is alert and oriented to person, place, and time.     Cranial Nerves: No cranial nerve deficit.     Sensory: No sensory deficit.  Psychiatric:        Mood and Affect: Mood normal.        Behavior: Behavior normal.        Thought Content: Thought content normal.        Judgment: Judgment normal.         Assessment And Plan:     1. Encounter for general adult medical examination w/o abnormal findings . Behavior modifications discussed and diet history reviewed.   . Pt will continue to exercise regularly and modify diet with low GI, plant based foods and decrease intake of processed foods.  . Recommend intake of daily multivitamin, Vitamin D, and calcium.  . Recommend mammogram and colonoscopy for preventive screenings, as well as recommend immunizations that include influenza, TDAP - POCT Urinalysis Dipstick (81002) - Vitamin D (25 hydroxy)  2. Mixed hyperlipidemia  Chronic, stable at last visit will recheck lipid panel today - BMP8+Anion Gap - Lipid Profile - CBC no Diff  3. Abnormal glucose  Chronic, stable  No current medications  Encouraged to limit intake of sugary foods and drinks  Encouraged to increase physical activity to 150 minutes per week as tolerated - Lipid Profile -  Hemoglobin A1c  4. Vitamin D deficiency  Will check vitamin D level and supplement as needed.     Also encouraged to spend 15 minutes in the sun daily.  - Vitamin D (25 hydroxy)   Arnette Felts, FNP    THE PATIENT IS ENCOURAGED TO PRACTICE SOCIAL DISTANCING DUE TO THE COVID-19 PANDEMIC.

## 2019-09-10 LAB — LIPID PANEL
Chol/HDL Ratio: 2.8 ratio (ref 0.0–4.4)
Cholesterol, Total: 278 mg/dL — ABNORMAL HIGH (ref 100–199)
HDL: 99 mg/dL (ref >39)
LDL Chol Calc (NIH): 164 mg/dL — ABNORMAL HIGH (ref 0–99)
Triglycerides: 94 mg/dL (ref 0–149)
VLDL Cholesterol Cal: 15 mg/dL (ref 5–40)

## 2019-09-10 LAB — BMP8+ANION GAP
Anion Gap: 10 mmol/L (ref 10.0–18.0)
BUN/Creatinine Ratio: 15 (ref 12–28)
BUN: 13 mg/dL (ref 8–27)
CO2: 27 mmol/L (ref 20–29)
Calcium: 9.1 mg/dL (ref 8.7–10.3)
Chloride: 105 mmol/L (ref 96–106)
Creatinine, Ser: 0.89 mg/dL (ref 0.57–1.00)
GFR calc Af Amer: 72 mL/min/{1.73_m2} (ref >59)
GFR calc non Af Amer: 63 mL/min/{1.73_m2} (ref >59)
Glucose: 88 mg/dL (ref 65–99)
Potassium: 4 mmol/L (ref 3.5–5.2)
Sodium: 142 mmol/L (ref 134–144)

## 2019-09-10 LAB — CBC
Hematocrit: 35.3 % (ref 34.0–46.6)
Hemoglobin: 11.4 g/dL (ref 11.1–15.9)
MCH: 27.9 pg (ref 26.6–33.0)
MCHC: 32.3 g/dL (ref 31.5–35.7)
MCV: 87 fL (ref 79–97)
Platelets: 304 10*3/uL (ref 150–450)
RBC: 4.08 x10E6/uL (ref 3.77–5.28)
RDW: 12.1 % (ref 11.7–15.4)
WBC: 3.6 10*3/uL (ref 3.4–10.8)

## 2019-09-10 LAB — HEMOGLOBIN A1C
Est. average glucose Bld gHb Est-mCnc: 128 mg/dL
Hgb A1c MFr Bld: 6.1 % — ABNORMAL HIGH (ref 4.8–5.6)

## 2019-09-10 LAB — VITAMIN D 25 HYDROXY (VIT D DEFICIENCY, FRACTURES): Vit D, 25-Hydroxy: 56.8 ng/mL (ref 30.0–100.0)

## 2020-01-19 ENCOUNTER — Ambulatory Visit: Payer: Self-pay | Admitting: Nurse Practitioner

## 2020-07-15 ENCOUNTER — Other Ambulatory Visit: Payer: Self-pay

## 2020-07-15 ENCOUNTER — Ambulatory Visit: Payer: Medicare PPO | Admitting: Nurse Practitioner

## 2020-07-15 ENCOUNTER — Ambulatory Visit (INDEPENDENT_AMBULATORY_CARE_PROVIDER_SITE_OTHER): Payer: Medicare PPO

## 2020-07-15 ENCOUNTER — Encounter: Payer: Self-pay | Admitting: Nurse Practitioner

## 2020-07-15 VITALS — BP 122/78 | HR 75 | Temp 98.1°F | Ht 63.8 in | Wt 133.4 lb

## 2020-07-15 DIAGNOSIS — R7309 Other abnormal glucose: Secondary | ICD-10-CM | POA: Diagnosis not present

## 2020-07-15 DIAGNOSIS — E782 Mixed hyperlipidemia: Secondary | ICD-10-CM

## 2020-07-15 DIAGNOSIS — Z0000 Encounter for general adult medical examination without abnormal findings: Secondary | ICD-10-CM | POA: Diagnosis not present

## 2020-07-15 DIAGNOSIS — Z5320 Procedure and treatment not carried out because of patient's decision for unspecified reasons: Secondary | ICD-10-CM

## 2020-07-15 NOTE — Patient Instructions (Signed)
[name] , Thank you for taking time to come for your Medicare Wellness Visit. I appreciate your ongoing commitment to your health goals. Please review the following plan we discussed and let me know if I can assist you in the future.   Screening recommendations/referrals: Colonoscopy: not required Mammogram: not required Bone Density: completed 10/09/2018 Recommended yearly ophthalmology/optometry visit for glaucoma screening and checkup Recommended yearly dental visit for hygiene and checkup  Vaccinations: Influenza vaccine: decline Pneumococcal vaccine: decline Tdap vaccine: completed 09/01/2013 Shingles vaccine: decline   Covid-19:decline  Advanced directives: Advance directive discussed with you today. Even though you declined this today please call our office should you change your mind and we can give you the proper paperwork for you to fill out.  Conditions/risks identified: none  Next appointment:10/27/2020 at 9:00  Follow up in one year for your annual wellness visit    Preventive Care 65 Years and Older, Female Preventive care refers to lifestyle choices and visits with your health care provider that can promote health and wellness. What does preventive care include?  A yearly physical exam. This is also called an annual well check.  Dental exams once or twice a year.  Routine eye exams. Ask your health care provider how often you should have your eyes checked.  Personal lifestyle choices, including:  Daily care of your teeth and gums.  Regular physical activity.  Eating a healthy diet.  Avoiding tobacco and drug use.  Limiting alcohol use.  Practicing safe sex.  Taking low-dose aspirin every day.  Taking vitamin and mineral supplements as recommended by your health care provider. What happens during an annual well check? The services and screenings done by your health care provider during your annual well check will depend on your age, overall health,  lifestyle risk factors, and family history of disease. Counseling  Your health care provider may ask you questions about your:  Alcohol use.  Tobacco use.  Drug use.  Emotional well-being.  Home and relationship well-being.  Sexual activity.  Eating habits.  History of falls.  Memory and ability to understand (cognition).  Work and work Astronomer.  Reproductive health. Screening  You may have the following tests or measurements:  Height, weight, and BMI.  Blood pressure.  Lipid and cholesterol levels. These may be checked every 5 years, or more frequently if you are over 72 years old.  Skin check.  Lung cancer screening. You may have this screening every year starting at age 31 if you have a 30-pack-year history of smoking and currently smoke or have quit within the past 15 years.  Fecal occult blood test (FOBT) of the stool. You may have this test every year starting at age 39.  Flexible sigmoidoscopy or colonoscopy. You may have a sigmoidoscopy every 5 years or a colonoscopy every 10 years starting at age 13.  Hepatitis C blood test.  Hepatitis B blood test.  Sexually transmitted disease (STD) testing.  Diabetes screening. This is done by checking your blood sugar (glucose) after you have not eaten for a while (fasting). You may have this done every 1-3 years.  Bone density scan. This is done to screen for osteoporosis. You may have this done starting at age 68.  Mammogram. This may be done every 1-2 years. Talk to your health care provider about how often you should have regular mammograms. Talk with your health care provider about your test results, treatment options, and if necessary, the need for more tests. Vaccines  Your health  care provider may recommend certain vaccines, such as:  Influenza vaccine. This is recommended every year.  Tetanus, diphtheria, and acellular pertussis (Tdap, Td) vaccine. You may need a Td booster every 10 years.  Zoster  vaccine. You may need this after age 41.  Pneumococcal 13-valent conjugate (PCV13) vaccine. One dose is recommended after age 39.  Pneumococcal polysaccharide (PPSV23) vaccine. One dose is recommended after age 40. Talk to your health care provider about which screenings and vaccines you need and how often you need them. This information is not intended to replace advice given to you by your health care provider. Make sure you discuss any questions you have with your health care provider. Document Released: 12/31/2015 Document Revised: 08/23/2016 Document Reviewed: 10/05/2015 Elsevier Interactive Patient Education  2017 [place] Prevention in the Home Falls can cause injuries. They can happen to people of all ages. There are many things you can do to make your home safe and to help prevent falls. What can I do on the outside of my home?  Regularly fix the edges of walkways and driveways and fix any cracks.  Remove anything that might make you trip as you walk through a door, such as a raised step or threshold.  Trim any bushes or trees on the path to your home.  Use bright outdoor lighting.  Clear any walking paths of anything that might make someone trip, such as rocks or tools.  Regularly check to see if handrails are loose or broken. Make sure that both sides of any steps have handrails.  Any raised decks and porches should have guardrails on the edges.  Have any leaves, snow, or ice cleared regularly.  Use sand or salt on walking paths during winter.  Clean up any spills in your garage right away. This includes oil or grease spills. What can I do in the bathroom?  Use night lights.  Install grab bars by the toilet and in the tub and shower. Do not use towel bars as grab bars.  Use non-skid mats or decals in the tub or shower.  If you need to sit down in the shower, use a plastic, non-slip stool.  Keep the floor dry. Clean up any water that spills on the  floor as soon as it happens.  Remove soap buildup in the tub or shower regularly.  Attach bath mats securely with double-sided non-slip rug tape.  Do not have throw rugs and other things on the floor that can make you trip. What can I do in the bedroom?  Use night lights.  Make sure that you have a light by your bed that is easy to reach.  Do not use any sheets or blankets that are too big for your bed. They should not hang down onto the floor.  Have a firm chair that has side arms. You can use this for support while you get dressed.  Do not have throw rugs and other things on the floor that can make you trip. What can I do in the kitchen?  Clean up any spills right away.  Avoid walking on wet floors.  Keep items that you use a lot in easy-to-reach places.  If you need to reach something above you, use a strong step stool that has a grab bar.  Keep electrical cords out of the way.  Do not use floor polish or wax that makes floors slippery. If you must use wax, use non-skid floor wax.  Do not have  throw rugs and other things on the floor that can make you trip. What can I do with my stairs?  Do not leave any items on the stairs.  Make sure that there are handrails on both sides of the stairs and use them. Fix handrails that are broken or loose. Make sure that handrails are as long as the stairways.  Check any carpeting to make sure that it is firmly attached to the stairs. Fix any carpet that is loose or worn.  Avoid having throw rugs at the top or bottom of the stairs. If you do have throw rugs, attach them to the floor with carpet tape.  Make sure that you have a light switch at the top of the stairs and the bottom of the stairs. If you do not have them, ask someone to add them for you. What else can I do to help prevent falls?  Wear shoes that:  Do not have high heels.  Have rubber bottoms.  Are comfortable and fit you well.  Are closed at the toe. Do not wear  sandals.  If you use a stepladder:  Make sure that it is fully opened. Do not climb a closed stepladder.  Make sure that both sides of the stepladder are locked into place.  Ask someone to hold it for you, if possible.  Clearly mark and make sure that you can see:  Any grab bars or handrails.  First and last steps.  Where the edge of each step is.  Use tools that help you move around (mobility aids) if they are needed. These include:  Canes.  Walkers.  Scooters.  Crutches.  Turn on the lights when you go into a dark area. Replace any light bulbs as soon as they burn out.  Set up your furniture so you have a clear path. Avoid moving your furniture around.  If any of your floors are uneven, fix them.  If there are any pets around you, be aware of where they are.  Review your medicines with your doctor. Some medicines can make you feel dizzy. This can increase your chance of falling. Ask your doctor what other things that you can do to help prevent falls. This information is not intended to replace advice given to you by your health care provider. Make sure you discuss any questions you have with your health care provider. Document Released: 09/30/2009 Document Revised: 05/11/2016 Document Reviewed: 01/08/2015 Elsevier Interactive Patient Education  2017 [employer].

## 2020-07-15 NOTE — Progress Notes (Signed)
This visit occurred during the SARS-CoV-2 public health emergency.  Safety protocols were in place, including screening questions prior to the visit, additional usage of staff PPE, and extensive cleaning of exam room while observing appropriate contact time as indicated for disinfecting solutions.  Subjective:   Sabrina Rivera is a 79 y.o. female who presents for Medicare Annual (Subsequent) preventive examination.  Review of Systems     Cardiac Risk Factors include: advanced age (>79men, >32 women)     Objective:    Today's Vitals   07/15/20 0839  BP: 122/78  Pulse: 75  Temp: 98.1 F (36.7 C)  TempSrc: Oral  SpO2: 97%  Weight: 133 lb 6.4 oz (60.5 kg)  Height: 5' 3.8" (1.621 m)   Body mass index is 23.04 kg/m.  Advanced Directives 07/15/2020 07/15/2019 10/16/2018  Does Patient Have a Medical Advance Directive? No No No  Would patient like information on creating a medical advance directive? No - Patient declined - No - Patient declined    Current Medications (verified) Outpatient Encounter Medications as of 07/15/2020  Medication Sig  . Cholecalciferol (VITAMIN D3) 1000 units CAPS Take 1 capsule by mouth daily.  Marland Kitchen co-enzyme Q-10 30 MG capsule Take 30 mg by mouth daily.  . Garlic 100 MG TABS Take 1 tablet by mouth.  Marland Kitchen glucosamine-chondroitin 500-400 MG tablet Take 1 tablet by mouth daily.  Marland Kitchen lactobacillus acidophilus (BACID) TABS tablet Take 1 tablet by mouth 3 (three) times daily.  . Multiple Vitamin (MULTIVITAMIN WITH MINERALS) TABS tablet Take 1 tablet by mouth daily.  Marland Kitchen olopatadine (PATADAY) 0.1 % ophthalmic solution 1 drop 2 (two) times daily. As needed  . Polyethyl Glycol-Propyl Glycol (SYSTANE OP) Apply 1 drop to eye as needed.  . Red Yeast Rice 600 MG CAPS Take 1 capsule by mouth daily.  . fluorometholone (FML) 0.1 % ophthalmic suspension Place 1 drop into both eyes 4 (four) times daily. (Patient not taking: Reported on 07/15/2020)   No facility-administered encounter  medications on file as of 07/15/2020.    Allergies (verified) Morphine and related   History: Past Medical History:  Diagnosis Date  . Hyperlipidemia   . Internal hemorrhoids   . Type 2 diabetes mellitus (HCC)   . Vitamin D deficiency    Past Surgical History:  Procedure Laterality Date  . DG RIGHT TIBIA AND FIBULA ([facility] HX)  1995   broke leg roller skating  . TONSILLECTOMY Bilateral in second grade   Family History  Problem Relation Age of Onset  . Heart disease Brother   . Hypertension Father   . Lung cancer Father   . Stroke Mother   . Hypertension Mother   . Thyroid disease Mother    Social History   Socioeconomic History  . Marital status: Divorced    Spouse name: Not on file  . Number of children: Not on file  . Years of education: Not on file  . Highest education level: Not on file  Occupational History  . Occupation: retired  Tobacco Use  . Smoking status: Never Smoker  . Smokeless tobacco: Never Used  Vaping Use  . Vaping Use: Never used  Substance and Sexual Activity  . Alcohol use: Never    Alcohol/week: 0.0 standard drinks  . Drug use: Never  . Sexual activity: Not Currently  Other Topics Concern  . Not on file  Social History Narrative  . Not on file   Social Determinants of Health   Financial Resource Strain: Low Risk   .  Difficulty of Paying Living Expenses: Not hard at all  Food Insecurity: No Food Insecurity  . Worried About Programme researcher, broadcasting/film/video in the Last Year: Never true  . Ran Out of Food in the Last Year: Never true  Transportation Needs: No Transportation Needs  . Lack of Transportation (Medical): No  . Lack of Transportation (Non-Medical): No  Physical Activity: Insufficiently Active  . Days of Exercise per Week: 4 days  . Minutes of Exercise per Session: 30 min  Stress: No Stress Concern Present  . Feeling of Stress : Not at all  Social Connections:   . Frequency of Communication with Friends and Family:   . Frequency of  Social Gatherings with Friends and Family:   . Attends Religious Services:   . Active Member of Clubs or Organizations:   . Attends Banker Meetings:   Marland Kitchen Marital Status:     Tobacco Counseling Counseling given: Not Answered   Clinical Intake:  Pre-visit preparation completed: Yes  Pain : No/denies pain     Nutritional Status: BMI of 19-24  Normal Nutritional Risks: None Diabetes: No  How often do you need to have someone help you when you read instructions, pamphlets, or other written materials from your doctor or pharmacy?: 1 - Never What is the last grade level you completed in school?: 2 yrs business college  Diabetic? no  Interpreter Needed?: No  Information entered by :: NAllen LPN   Activities of Daily Living In your present state of health, do you have any difficulty performing the following activities: 07/15/2020 07/15/2020  Hearing? N N  Vision? N N  Difficulty concentrating or making decisions? N N  Walking or climbing stairs? N N  Dressing or bathing? N N  Doing errands, shopping? N N  Preparing Food and eating ? - N  Using the Toilet? - N  In the past six months, have you accidently leaked urine? - Y  Do you have problems with loss of bowel control? - N  Managing your Medications? - N  Managing your Finances? - N  Housekeeping or managing your Housekeeping? - N  Some recent data might be hidden    Patient Care Team: Arnette Felts, FNP as PCP - General (General Practice)  Indicate any recent Medical Services you may have received from other than [facility] providers in the past year (date may be approximate).     Assessment:   This is a routine wellness examination for Sabrina Rivera.  Hearing/Vision screen  Hearing Screening   125Hz  250Hz  500Hz  1000Hz  2000Hz  3000Hz  4000Hz  6000Hz  8000Hz   Right ear:           Left ear:           Vision Screening Comments: Regular eye exams, Dr.  Dietary issues and exercise activities  discussed: Current Exercise Habits: Home exercise routine, Type of exercise: walking;stretching;strength training/weights, Time (Minutes): 30, Frequency (Times/Week): 4, Weekly Exercise (Minutes/Week): 120  Goals    .  DIET - REDUCE PORTION SIZE (pt-stated)      Wants to decrease in between snacking and watch portions    .  Patient Stated      07/15/2019 wants to watch portions and decrease sweet intake    .  Patient Stated      07/15/2020, keep from getting stiff      Depression Screen PHQ 2/9 Scores 07/15/2020 09/09/2019 07/15/2019 10/16/2018 10/16/2018  PHQ - 2 Score 0 0 0 0 0  PHQ- 9 Score 0 -  0 - -    Fall Risk Fall Risk  07/15/2020 09/09/2019 07/15/2019 10/16/2018 10/16/2018  Falls in the past year? 0 0 0 No No  Risk for fall due to : No Fall Risks - - Medication side effect -  Follow up Falls evaluation completed;Education provided;Falls prevention discussed - Falls evaluation completed;Falls prevention discussed - -    Any stairs in or around the home? Yes  If so, are there any without handrails? Yes  Home free of loose throw rugs in walkways, pet beds, electrical cords, etc? Yes  Adequate lighting in your home to reduce risk of falls? Yes   ASSISTIVE DEVICES UTILIZED TO PREVENT FALLS:  Life alert? No  Use of a cane, walker or w/c? No  Grab bars in the bathroom? No  Shower chair or bench in shower? No  Elevated toilet seat or a handicapped toilet? No   TIMED UP AND GO:  Was the test performed? No .    Gait steady and fast without use of assistive device  Cognitive Function:     6CIT Screen 07/15/2020 07/15/2019 10/16/2018  What Year? 0 points 0 points 0 points  What month? 0 points 0 points 0 points  What time? 0 points 0 points 0 points  Count back from 20 0 points 0 points 0 points  Months in reverse 0 points 0 points 0 points  Repeat phrase 0 points 0 points 0 points  Total Score 0 0 0    Immunizations  There is no immunization history on file for this  patient.  TDAP status: Up to date Flu Vaccine status: Declined, Education has been provided regarding the importance of this vaccine but patient still declined. Advised may receive this vaccine at local pharmacy or Health Dept. Aware to provide a copy of the vaccination record if obtained from local pharmacy or Health Dept. Verbalized acceptance and understanding. Pneumococcal vaccine status: Declined,  Education has been provided regarding the importance of this vaccine but patient still declined. Advised may receive this vaccine at local pharmacy or Health Dept. Aware to provide a copy of the vaccination record if obtained from local pharmacy or Health Dept. Verbalized acceptance and understanding.  Covid-19 vaccine status: Declined, Education has been provided regarding the importance of this vaccine but patient still declined. Advised may receive this vaccine at local pharmacy or Health Dept.or vaccine clinic. Aware to provide a copy of the vaccination record if obtained from local pharmacy or Health Dept. Verbalized acceptance and understanding.  Qualifies for Shingles Vaccine? Yes   Zostavax completed No   Shingrix Completed?: No.    Education has been provided regarding the importance of this vaccine. Patient has been advised to call insurance company to determine out of pocket expense if they have not yet received this vaccine. Advised may also receive vaccine at local pharmacy or Health Dept. Verbalized acceptance and understanding.  Screening Tests Health Maintenance  Topic Date Due  . COVID-19 Vaccine (1) 07/31/2020 (Originally 04/18/1953)  . Hepatitis C Screening  07/15/2021 (Originally 07/24/1941)  . PNA vac Low Risk Adult (1 of 2 - PCV13) 07/15/2021 (Originally 04/19/2007)  . INFLUENZA VACCINE  07/18/2020  . TETANUS/TDAP  09/02/2023  . DEXA SCAN  Completed    Health Maintenance  There are no preventive care reminders to display for this patient.  Colorectal cancer screening: No  longer required.  Mammogram status: No longer required.  Bone Density status: Completed 10/09/2018.   Lung Cancer Screening: (Low Dose CT Chest recommended if  Age 45-80 years, 30 pack-year currently smoking OR have quit w/in 15years.) does not qualify.   Lung Cancer Screening Referral: no  Additional Screening:  Hepatitis C Screening: does qualify; declines  Vision Screening: Recommended annual ophthalmology exams for early detection of glaucoma and other disorders of the eye. Is the patient up to date with their annual eye exam?  Yes  Who is the provider or what is the name of the office in which the patient attends annual eye exams? Dr. Hubbard Robinson If pt is not established with a provider, would they like to be referred to a provider to establish care? No .   Dental Screening: Recommended annual dental exams for proper oral hygiene  Community Resource Referral / Chronic Care Management: CRR required this visit?  No   CCM required this visit?  No      Plan:     I have personally reviewed and noted the following in the patient's chart:   . Medical and social history . Use of alcohol, tobacco or illicit drugs  . Current medications and supplements . Functional ability and status . Nutritional status . Physical activity . Advanced directives . List of other physicians . Hospitalizations, surgeries, and ER visits in previous 12 months . Vitals . Screenings to include cognitive, depression, and falls . Referrals and appointments  In addition, I have reviewed and discussed with patient certain preventive protocols, quality metrics, and best practice recommendations. A written personalized care plan for preventive services as well as general preventive health recommendations were provided to patient.     Barb Merino, LPN   7/74/1287   Nurse Notes: Patient declines vaccines.

## 2020-07-15 NOTE — Progress Notes (Signed)
 This visit occurred during the SARS-CoV-2 public health emergency.  Safety protocols were in place, including screening questions prior to the visit, additional usage of staff PPE, and extensive cleaning of exam room while observing appropriate contact time as indicated for disinfecting solutions.  Subjective:     Patient ID: Sabrina Rivera , female    DOB: 10/22/1941 , 79 y.o.   MRN: [id number]   Chief Complaint  Patient presents with  . Hyperlipidemia  . abnormal glucose f/u    HPI  Patient here for abnormal glucose check and cholesterol check. She has a history of hyperlipidemia and is taking red yeast rice and coq10. She is exercising regularly to help with her cholesterol.      Past Medical History:  Diagnosis Date  . Hyperlipidemia   . Internal hemorrhoids   . Type 2 diabetes mellitus (HCC)   . Vitamin D deficiency      Family History  Problem Relation Age of Onset  . Heart disease Brother   . Hypertension Father   . Lung cancer Father   . Stroke Mother   . Hypertension Mother   . Thyroid disease Mother      Current Outpatient Medications:  .  Cholecalciferol (VITAMIN D3) 1000 units CAPS, Take 1 capsule by mouth daily., Disp: , Rfl:  .  co-enzyme Q-10 30 MG capsule, Take 30 mg by mouth daily., Disp: , Rfl:  .  fluorometholone (FML) 0.1 % ophthalmic suspension, Place 1 drop into both eyes 4 (four) times daily. (Patient not taking: Reported on 07/15/2020), Disp: , Rfl:  .  Garlic 100 MG TABS, Take 1 tablet by mouth., Disp: , Rfl:  .  glucosamine-chondroitin 500-400 MG tablet, Take 1 tablet by mouth daily., Disp: , Rfl:  .  lactobacillus acidophilus (BACID) TABS tablet, Take 1 tablet by mouth 3 (three) times daily., Disp: , Rfl:  .  Multiple Vitamin (MULTIVITAMIN WITH MINERALS) TABS tablet, Take 1 tablet by mouth daily., Disp: , Rfl:  .  olopatadine (PATADAY) 0.1 % ophthalmic solution, 1 drop 2 (two) times daily. As needed, Disp: , Rfl:  .  Polyethyl Glycol-Propyl  Glycol (SYSTANE OP), Apply 1 drop to eye as needed., Disp: , Rfl:  .  Red Yeast Rice 600 MG CAPS, Take 1 capsule by mouth daily., Disp: , Rfl:    Allergies  Allergen Reactions  . Morphine And Related Nausea And Vomiting     Review of Systems  Constitutional: Negative.  Negative for fatigue.  Respiratory: Negative.   Cardiovascular: Negative.  Negative for chest pain, palpitations and leg swelling.  Neurological: Negative for dizziness and headaches.  Psychiatric/Behavioral: Negative.      Today's Vitals   07/15/20 0848  BP: 122/78  Pulse: 75  Temp: 98.1 F (36.7 C)  TempSrc: Oral  Weight: 133 lb 6.1 oz (60.5 kg)  Height: 5' 3.8" (1.621 m)  PainSc: 0-No pain   Body mass index is 23.04 kg/m.   Objective:  Physical Exam Constitutional:      General: She is not in acute distress.    Appearance: Normal appearance.  Cardiovascular:     Rate and Rhythm: Normal rate and regular rhythm.     Pulses: Normal pulses.     Heart sounds: Normal heart sounds. No murmur heard.   Pulmonary:     Effort: Pulmonary effort is normal. No respiratory distress.     Breath sounds: Normal breath sounds. No wheezing.  Neurological:     General: No focal deficit present.       Mental Status: She is alert and oriented to person, place, and time.     Cranial Nerves: No cranial nerve deficit.  Psychiatric:        Mood and Affect: Mood normal.        Behavior: Behavior normal.        Thought Content: Thought content normal.        Judgment: Judgment normal.         Assessment And Plan:     1. Mixed hyperlipidemia  Chronic, was elevated at last visit she does not want to take any statins.  She is using red yeast rice and exercising.    Will check lipid panel today - Lipid panel - BMP8+eGFR  2. Abnormal glucose  Chronic, stable  No current medications  Encouraged to limit intake of sugary foods and drinks  Encouraged to continue physical activity at a minimum of 150 minutes per  week - Hemoglobin A1c  3. Statin declined  Discussed the importance of statin therapy with hyperlipidemia    Patient was given opportunity to ask questions. Patient verbalized understanding of the plan and was able to repeat key elements of the plan. All questions were answered to their satisfaction.   , FNP   I,  , FNP, have reviewed all documentation for this visit. The documentation on 07/15/20 for the exam, diagnosis, procedures, and orders are all accurate and complete.  THE PATIENT IS ENCOURAGED TO PRACTICE SOCIAL DISTANCING DUE TO THE COVID-19 PANDEMIC.   

## 2020-07-16 LAB — BMP8+EGFR
BUN/Creatinine Ratio: 14 (ref 12–28)
BUN: 12 mg/dL (ref 8–27)
CO2: 27 mmol/L (ref 20–29)
Calcium: 9.5 mg/dL (ref 8.7–10.3)
Chloride: 104 mmol/L (ref 96–106)
Creatinine, Ser: 0.85 mg/dL (ref 0.57–1.00)
GFR calc Af Amer: 76 mL/min/{1.73_m2} (ref >59)
GFR calc non Af Amer: 66 mL/min/{1.73_m2} (ref >59)
Glucose: 87 mg/dL (ref 65–99)
Potassium: 4.4 mmol/L (ref 3.5–5.2)
Sodium: 143 mmol/L (ref 134–144)

## 2020-07-16 LAB — LIPID PANEL
Chol/HDL Ratio: 3 ratio (ref 0.0–4.4)
Cholesterol, Total: 304 mg/dL — ABNORMAL HIGH (ref 100–199)
HDL: 102 mg/dL (ref >39)
LDL Chol Calc (NIH): 185 mg/dL — ABNORMAL HIGH (ref 0–99)
Triglycerides: 101 mg/dL (ref 0–149)
VLDL Cholesterol Cal: 17 mg/dL (ref 5–40)

## 2020-07-16 LAB — HEMOGLOBIN A1C
Est. average glucose Bld gHb Est-mCnc: 131 mg/dL
Hgb A1c MFr Bld: 6.2 % — ABNORMAL HIGH (ref 4.8–5.6)

## 2020-08-31 NOTE — Progress Notes (Signed)
We will have her to come back in 2 months for recheck levels. If she is willing to come to office and purchase a supplement for cholesterol that contains red yeast rice it is $45 a month she would take 4 tabs daily and make sure she is taking 81 mg ASA daily as well.

## 2020-09-01 ENCOUNTER — Encounter: Payer: Self-pay | Admitting: Nurse Practitioner

## 2020-09-01 DIAGNOSIS — E782 Mixed hyperlipidemia: Secondary | ICD-10-CM | POA: Insufficient documentation

## 2020-09-01 DIAGNOSIS — Z5320 Procedure and treatment not carried out because of patient's decision for unspecified reasons: Secondary | ICD-10-CM

## 2020-09-01 HISTORY — DX: Procedure and treatment not carried out because of patient's decision for unspecified reasons: Z53.20

## 2020-09-01 NOTE — Progress Notes (Signed)
She can get the enteric coated aspirin, otherwise okay.

## 2020-09-27 ENCOUNTER — Ambulatory Visit: Payer: Medicare PPO | Admitting: Nurse Practitioner

## 2020-09-27 ENCOUNTER — Encounter: Payer: Self-pay | Admitting: Nurse Practitioner

## 2020-09-27 ENCOUNTER — Other Ambulatory Visit: Payer: Self-pay

## 2020-09-27 VITALS — BP 122/78 | HR 63 | Temp 98.0°F | Ht 63.8 in | Wt 137.0 lb

## 2020-09-27 DIAGNOSIS — Z2821 Immunization not carried out because of patient refusal: Secondary | ICD-10-CM

## 2020-09-27 DIAGNOSIS — Z0000 Encounter for general adult medical examination without abnormal findings: Secondary | ICD-10-CM

## 2020-09-27 DIAGNOSIS — R7309 Other abnormal glucose: Secondary | ICD-10-CM

## 2020-09-27 DIAGNOSIS — R011 Cardiac murmur, unspecified: Secondary | ICD-10-CM | POA: Diagnosis not present

## 2020-09-27 DIAGNOSIS — Z5320 Procedure and treatment not carried out because of patient's decision for unspecified reasons: Secondary | ICD-10-CM

## 2020-09-27 DIAGNOSIS — E782 Mixed hyperlipidemia: Secondary | ICD-10-CM

## 2020-09-27 NOTE — Patient Instructions (Signed)
Health Maintenance, Female Adopting a healthy lifestyle and getting preventive care are important in promoting health and wellness. Ask your health care provider about:  The right schedule for you to have regular tests and exams.  Things you can do on your own to prevent diseases and keep yourself healthy. What should I know about diet, weight, and exercise? Eat a healthy diet   Eat a diet that includes plenty of vegetables, fruits, low-fat dairy products, and lean protein.  Do not eat a lot of foods that are high in solid fats, added sugars, or sodium. Maintain a healthy weight Body mass index (BMI) is used to identify weight problems. It estimates body fat based on height and weight. Your health care provider can help determine your BMI and help you achieve or maintain a healthy weight. Get regular exercise Get regular exercise. This is one of the most important things you can do for your health. Most adults should:  Exercise for at least 150 minutes each week. The exercise should increase your heart rate and make you sweat (moderate-intensity exercise).  Do strengthening exercises at least twice a week. This is in addition to the moderate-intensity exercise.  Spend less time sitting. Even light physical activity can be beneficial. Watch cholesterol and blood lipids Have your blood tested for lipids and cholesterol at 78 years of age, then have this test every 5 years. Have your cholesterol levels checked more often if:  Your lipid or cholesterol levels are high.  You are older than 78 years of age.  You are at high risk for heart disease. What should I know about cancer screening? Depending on your health history and family history, you may need to have cancer screening at various ages. This may include screening for:  Breast cancer.  Cervical cancer.  Colorectal cancer.  Skin cancer.  Lung cancer. What should I know about heart disease, diabetes, and high blood  pressure? Blood pressure and heart disease  High blood pressure causes heart disease and increases the risk of stroke. This is more likely to develop in people who have high blood pressure readings, are of African descent, or are overweight.  Have your blood pressure checked: ? Every 3-5 years if you are 18-39 years of age. ? Every year if you are 40 years old or older. Diabetes Have regular diabetes screenings. This checks your fasting blood sugar level. Have the screening done:  Once every three years after age 40 if you are at a normal weight and have a low risk for diabetes.  More often and at a younger age if you are overweight or have a high risk for diabetes. What should I know about preventing infection? Hepatitis B If you have a higher risk for hepatitis B, you should be screened for this virus. Talk with your health care provider to find out if you are at risk for hepatitis B infection. Hepatitis C Testing is recommended for:  Everyone born from 1945 through 1965.  Anyone with known risk factors for hepatitis C. Sexually transmitted infections (STIs)  Get screened for STIs, including gonorrhea and chlamydia, if: ? You are sexually active and are younger than 78 years of age. ? You are older than 78 years of age and your health care provider tells you that you are at risk for this type of infection. ? Your sexual activity has changed since you were last screened, and you are at increased risk for chlamydia or gonorrhea. Ask your health care provider if   you are at risk.  Ask your health care provider about whether you are at high risk for HIV. Your health care provider may recommend a prescription medicine to help prevent HIV infection. If you choose to take medicine to prevent HIV, you should first get tested for HIV. You should then be tested every 3 months for as long as you are taking the medicine. Pregnancy  If you are about to stop having your period (premenopausal) and  you may become pregnant, seek counseling before you get pregnant.  Take 400 to 800 micrograms (mcg) of folic acid every day if you become pregnant.  Ask for birth control (contraception) if you want to prevent pregnancy. Osteoporosis and menopause Osteoporosis is a disease in which the bones lose minerals and strength with aging. This can result in bone fractures. If you are 65 years old or older, or if you are at risk for osteoporosis and fractures, ask your health care provider if you should:  Be screened for bone loss.  Take a calcium or vitamin D supplement to lower your risk of fractures.  Be given hormone replacement therapy (HRT) to treat symptoms of menopause. Follow these instructions at home: Lifestyle  Do not use any products that contain nicotine or tobacco, such as cigarettes, e-cigarettes, and chewing tobacco. If you need help quitting, ask your health care provider.  Do not use street drugs.  Do not share needles.  Ask your health care provider for help if you need support or information about quitting drugs. Alcohol use  Do not drink alcohol if: ? Your health care provider tells you not to drink. ? You are pregnant, may be pregnant, or are planning to become pregnant.  If you drink alcohol: ? Limit how much you use to 0-1 drink a day. ? Limit intake if you are breastfeeding.  Be aware of how much alcohol is in your drink. In the [place] one drink equals one 12 oz bottle of beer (355 mL), one 5 oz glass of wine (148 mL), or one 1 oz glass of hard liquor (44 mL). General instructions  Schedule regular health, dental, and eye exams.  Stay current with your vaccines.  Tell your health care provider if: ? You often feel depressed. ? You have ever been abused or do not feel safe at home. Summary  Adopting a healthy lifestyle and getting preventive care are important in promoting health and wellness.  Follow your health care provider's instructions about healthy  diet, exercising, and getting tested or screened for diseases.  Follow your health care provider's instructions on monitoring your cholesterol and blood pressure. This information is not intended to replace advice given to you by your health care provider. Make sure you discuss any questions you have with your health care provider. Document Revised: 11/27/2018 Document Reviewed: 11/27/2018 Elsevier Patient Education  2020 [employer]  

## 2020-09-27 NOTE — Progress Notes (Signed)
Tomasa Hose as a scribe for Arnette Felts, FNP.,have documented all relevant documentation on the behalf of Arnette Felts, FNP,as directed by  Arnette Felts, FNP while in the presence of Arnette Felts, FNP.   This visit occurred during the SARS-CoV-2 public health emergency.  Safety protocols were in place, including screening questions prior to the visit, additional usage of staff PPE, and extensive cleaning of exam room while observing appropriate contact time as indicated for disinfecting solutions.  Subjective:     Patient ID: Sabrina Rivera , female    DOB: 11-04-1941 , 79 y.o.   MRN: [id number]   Chief Complaint  Patient presents with  . Annual Exam    HPI  Here for HM   She is having right lower groin discomfort - she has [facility]      Past Medical History:  Diagnosis Date  . Hyperlipidemia   . Internal hemorrhoids   . Statin declined 09/01/2020  . Type 2 diabetes mellitus (HCC)   . Vitamin D deficiency      Family History  Problem Relation Age of Onset  . Heart disease Brother   . Hypertension Father   . Lung cancer Father   . Stroke Mother   . Hypertension Mother   . Thyroid disease Mother      Current Outpatient Medications:  .  Cholecalciferol (VITAMIN D3) 1000 units CAPS, Take 1 capsule by mouth daily., Disp: , Rfl:  .  co-enzyme Q-10 30 MG capsule, Take 30 mg by mouth daily., Disp: , Rfl:  .  Garlic 100 MG TABS, Take 1 tablet by mouth., Disp: , Rfl:  .  glucosamine-chondroitin 500-400 MG tablet, Take 1 tablet by mouth daily., Disp: , Rfl:  .  lactobacillus acidophilus (BACID) TABS tablet, Take 1 tablet by mouth 3 (three) times daily., Disp: , Rfl:  .  Multiple Vitamin (MULTIVITAMIN WITH MINERALS) TABS tablet, Take 1 tablet by mouth daily., Disp: , Rfl:  .  Naphazoline-Pheniramine (EYE ALLERGY RELIEF OP), Apply to eye., Disp: , Rfl:  .  Red Yeast Rice 600 MG CAPS, Take 1 capsule by mouth daily., Disp: , Rfl:  .  UNABLE  TO FIND, Med Name [facility], Disp: , Rfl:    Allergies  Allergen Reactions  . Morphine And Related Nausea And Vomiting      The patient states she uses post menopausal status.  Negative for Dysmenorrhea and Negative for Menorrhagia. Negative for: breast discharge, breast lump(s), breast pain and breast self exam. Associated symptoms include abnormal vaginal bleeding. Pertinent negatives include abnormal bleeding (hematology), anxiety, decreased libido, depression, difficulty falling sleep, dyspareunia, history of infertility, nocturia, sexual dysfunction, sleep disturbances, urinary incontinence, urinary urgency, vaginal discharge and vaginal itching. Diet regular; healthy. The patient states her exercise level is moderate - she walks daily  The patient's tobacco use is:  Social History   Tobacco Use  Smoking Status Never Smoker  Smokeless Tobacco Never Used   She has been exposed to passive smoke. The patient's alcohol use is:  Social History   Substance and Sexual Activity  Alcohol Use Never  . Alcohol/week: 0.0 standard drinks     Review of Systems  Constitutional: Negative.   HENT: Negative.   Eyes: Negative.   Respiratory: Negative.   Cardiovascular: Negative.  Negative for chest pain, palpitations and leg swelling.  Gastrointestinal: Negative.   Endocrine: Negative.   Genitourinary: Negative.   Musculoskeletal: Negative.   Skin: Negative.   Allergic/Immunologic: Negative.  Neurological: Negative.  Negative for dizziness and headaches.       She will occasionally have feet and hand tingling. She is not interested in seeing any specialist  Hematological: Negative.   Psychiatric/Behavioral: Negative.      Today's Vitals   09/27/20 1145  BP: 122/78  Pulse: 63  Temp: 98 F (36.7 C)  TempSrc: Oral  Weight: 137 lb (62.1 kg)  Height: 5' 3.8" (1.621 m)  PainSc: 0-No pain   Body mass index is 23.66 kg/m.   Objective:  Physical Exam Constitutional:       General: She is not in acute distress.    Appearance: Normal appearance. She is well-developed. She is obese.  HENT:     Head: Normocephalic and atraumatic.     Right Ear: Hearing, tympanic membrane, ear canal and external ear normal. There is no impacted cerumen.     Left Ear: Hearing, tympanic membrane, ear canal and external ear normal. There is no impacted cerumen.     Nose:     Comments: Deferred - masked    Mouth/Throat:     Comments: Deferred - masked Eyes:     General: Lids are normal.     Extraocular Movements: Extraocular movements intact.     Conjunctiva/sclera: Conjunctivae normal.     Pupils: Pupils are equal, round, and reactive to light.     Funduscopic exam:    Right eye: No papilledema.        Left eye: No papilledema.  Neck:     Thyroid: No thyroid mass.     Vascular: No carotid bruit.  Cardiovascular:     Rate and Rhythm: Normal rate and regular rhythm.     Pulses: Normal pulses.     Heart sounds: Murmur heard.   Pulmonary:     Effort: Pulmonary effort is normal.     Breath sounds: Normal breath sounds.  Abdominal:     General: Abdomen is flat. Bowel sounds are normal. There is no distension.     Palpations: Abdomen is soft.     Tenderness: There is no abdominal tenderness.  Genitourinary:    Rectum: Guaiac result negative.  Musculoskeletal:        General: No swelling. Normal range of motion.     Cervical back: Full passive range of motion without pain, normal range of motion and neck supple.     Right lower leg: No edema.     Left lower leg: No edema.  Skin:    General: Skin is warm and dry.     Capillary Refill: Capillary refill takes less than 2 seconds.  Neurological:     General: No focal deficit present.     Mental Status: She is alert and oriented to person, place, and time.     Cranial Nerves: No cranial nerve deficit.     Sensory: No sensory deficit.  Psychiatric:        Mood and Affect: Mood normal.        Behavior: Behavior normal.         Thought Content: Thought content normal.        Judgment: Judgment normal.         Assessment And Plan:     1. Encounter for general adult medical examination w/o abnormal findings . Behavior modifications discussed and diet history reviewed.   . Pt will continue to exercise regularly and modify diet with low GI, plant based foods and decrease intake of processed foods.  . Recommend intake  of daily multivitamin, Vitamin D, and calcium.  . Recommend for preventive screenings, as well as recommend immunizations that include influenza, TDAP (up to date)   2. COVID-19 vaccination declined  Declines covid 19 vaccine. Discussed risk of covid 59 and if she changes her mind about the vaccine to call the office.  Encouraged to take multivitamin, vitamin d, vitamin c and zinc to increase immune system. Aware can call office if would like to have vaccine here at office.   3. Mixed hyperlipidemia  Chronic, she has not been well controlled and has not wanted to start a statin.  Discussed risk of cardiac disease   4. Statin declined   5. Abnormal glucose  Chronic, stable  No current medications  Encouraged to limit intake of sugary foods and drinks  Encouraged to increase physical activity to 150 minutes per week as tolerated - Hemoglobin A1c  6. Murmur  She has a murmur present and due to having her elevated cholesterol levels I will refer to cardiology for further evaluation     Patient was given opportunity to ask questions. Patient verbalized understanding of the plan and was able to repeat key elements of the plan. All questions were answered to their satisfaction.   Jeanell Sparrow, FNP, have reviewed all documentation for this visit. The documentation on 09/27/20 for the exam, diagnosis, procedures, and orders are all accurate and complete.  THE PATIENT IS ENCOURAGED TO PRACTICE SOCIAL DISTANCING DUE TO THE COVID-19 PANDEMIC.

## 2020-10-25 ENCOUNTER — Encounter: Payer: Self-pay | Admitting: General Practice

## 2020-10-27 ENCOUNTER — Encounter: Payer: Medicare PPO | Admitting: Nurse Practitioner

## 2020-11-01 ENCOUNTER — Encounter: Payer: Medicare PPO | Admitting: Nurse Practitioner

## 2020-12-21 DIAGNOSIS — U071 COVID-19: Secondary | ICD-10-CM | POA: Diagnosis not present

## 2020-12-21 DIAGNOSIS — Z20828 Contact with and (suspected) exposure to other viral communicable diseases: Secondary | ICD-10-CM | POA: Diagnosis not present

## 2021-01-27 ENCOUNTER — Telehealth: Payer: Self-pay

## 2021-01-27 ENCOUNTER — Ambulatory Visit: Payer: Medicare PPO

## 2021-01-27 NOTE — Telephone Encounter (Signed)
Patient called stating she is not going to be able to do her medicare annual visit today nor mondays office visit for her chol check due to having to care for her brother and his wife. She declined to make reschedule her appointment at this time and said she will call back once she figures her schedule out. YL,RMA

## 2021-01-31 ENCOUNTER — Ambulatory Visit: Payer: Medicare PPO | Admitting: Nurse Practitioner

## 2021-02-23 ENCOUNTER — Telehealth: Payer: Self-pay | Admitting: Nurse Practitioner

## 2021-02-23 NOTE — Telephone Encounter (Signed)
Left message asking patient to call office to schedule AWVS  Calender year [employer] Last awvs 07/15/20

## 2021-06-01 ENCOUNTER — Telehealth: Payer: Self-pay | Admitting: Nurse Practitioner

## 2021-06-01 NOTE — Telephone Encounter (Signed)
Tried calling patient to schedule Medicare Annual Wellness Visit (AWV) either virtually or in office.   Last AWV 07/15/20  [employer] insurance can be calendar year  please schedule at anytime with [facility]    This should be a 45 minute visit.

## 2021-06-29 ENCOUNTER — Telehealth: Payer: Self-pay | Admitting: Nurse Practitioner

## 2021-06-29 NOTE — Telephone Encounter (Signed)
Left message for patient to call back and schedule Medicare Annual Wellness Visit (AWV) either virtually or in office.   Last AWV 07/15/20  [employer] can do calendar year  please schedule at anytime with [facility] Nurse Health Advisor   This should be a 45 minute visit.

## 2021-07-20 ENCOUNTER — Ambulatory Visit: Payer: Medicare PPO | Admitting: Nurse Practitioner

## 2021-07-20 ENCOUNTER — Ambulatory Visit: Payer: Medicare PPO

## 2021-07-20 ENCOUNTER — Telehealth: Payer: Self-pay

## 2021-07-20 NOTE — Telephone Encounter (Signed)
This nurse called patient in regards to missed appointments for today. Patient states that she did not know that she had any appointments. Rescheduled for 07/27/2021.

## 2021-07-27 ENCOUNTER — Ambulatory Visit (INDEPENDENT_AMBULATORY_CARE_PROVIDER_SITE_OTHER): Payer: Medicare PPO

## 2021-07-27 ENCOUNTER — Encounter: Payer: Self-pay | Admitting: Nurse Practitioner

## 2021-07-27 ENCOUNTER — Ambulatory Visit: Payer: Medicare PPO | Admitting: Nurse Practitioner

## 2021-07-27 ENCOUNTER — Other Ambulatory Visit: Payer: Self-pay

## 2021-07-27 VITALS — BP 110/62 | HR 60 | Temp 98.8°F | Ht 63.4 in | Wt 133.0 lb

## 2021-07-27 DIAGNOSIS — E782 Mixed hyperlipidemia: Secondary | ICD-10-CM | POA: Diagnosis not present

## 2021-07-27 DIAGNOSIS — Z1159 Encounter for screening for other viral diseases: Secondary | ICD-10-CM

## 2021-07-27 DIAGNOSIS — B351 Tinea unguium: Secondary | ICD-10-CM

## 2021-07-27 DIAGNOSIS — Z0000 Encounter for general adult medical examination without abnormal findings: Secondary | ICD-10-CM

## 2021-07-27 DIAGNOSIS — Z5320 Procedure and treatment not carried out because of patient's decision for unspecified reasons: Secondary | ICD-10-CM

## 2021-07-27 DIAGNOSIS — R7309 Other abnormal glucose: Secondary | ICD-10-CM

## 2021-07-27 MED ORDER — LAMISIL AT SPRAY 1 % EX SOLN: 2.0000 | Freq: Every day | CUTANEOUS | 2 refills | Status: DC

## 2021-07-27 NOTE — Progress Notes (Signed)
I,Tianna Badgett,acting as a Education administrator for [employer], FNP.,have documented all relevant documentation on the behalf of Minette Brine, FNP,as directed by  Minette Brine, FNP while in the presence of Minette Brine, [place].  This visit occurred during the SARS-CoV-2 public health emergency.  Safety protocols were in place, including screening questions prior to the visit, additional usage of staff PPE, and extensive cleaning of exam room while observing appropriate contact time as indicated for disinfecting solutions.  Subjective:     Patient ID: Sabrina Rivera , female    DOB: Sep 30, 1941 , 80 y.o.   MRN: [id number]   Chief Complaint  Patient presents with   Hyperlipidemia    HPI  Patient here for abnormal glucose check and cholesterol check. She has a history of hyperlipidemia and is taking red yeast rice and coq10. She is taking vitamin c and zinc. She is exercising regularly by bowling to help with her cholesterol.  She does not want to take a statin - when she took in the past she had bad side effects  Wt Readings from Last 3 Encounters: 07/27/21 : 133 lb (60.3 kg) 09/27/20 : 137 lb (62.1 kg) 07/15/20 : 133 lb 6.1 oz (60.5 kg)    Hyperlipidemia This is a chronic problem. The current episode started more than 1 year ago. The problem is uncontrolled. Recent lipid tests were reviewed and are high. She has no history of chronic renal disease. There are no known factors aggravating her hyperlipidemia. Current antihyperlipidemic treatment includes diet change (red yeast rice and CoQ10). There are no compliance problems.  Risk factors for coronary artery disease include a sedentary lifestyle.    Past Medical History:  Diagnosis Date   Hyperlipidemia    Internal hemorrhoids    Statin declined 09/01/2020   Type 2 diabetes mellitus ([place])    Vitamin D deficiency      Family History  Problem Relation Age of Onset   Heart disease Brother    Hypertension Father    Lung cancer Father    Stroke  Mother    Hypertension Mother    Thyroid disease Mother      Current Outpatient Medications:    Ascorbic Acid (VITAMIN C) 100 MG tablet, Take 100 mg by mouth daily., Disp: , Rfl:    Cholecalciferol (VITAMIN D3) 1000 units CAPS, Take 1 capsule by mouth daily., Disp: , Rfl:    co-enzyme Q-10 30 MG capsule, Take 30 mg by mouth daily., Disp: , Rfl:    Garlic 106 MG TABS, Take 1 tablet by mouth., Disp: , Rfl:    glucosamine-chondroitin 500-400 MG tablet, Take 1 tablet by mouth daily., Disp: , Rfl:    lactobacillus acidophilus (BACID) TABS tablet, Take 1 tablet by mouth 3 (three) times daily., Disp: , Rfl:    Multiple Vitamin (MULTIVITAMIN WITH MINERALS) TABS tablet, Take 1 tablet by mouth daily., Disp: , Rfl:    Naphazoline-Pheniramine (EYE ALLERGY RELIEF OP), Apply to eye., Disp: , Rfl:    Red Yeast Rice 600 MG CAPS, Take 1 capsule by mouth daily., Disp: , Rfl:    Terbinafine HCl (LAMISIL AT SPRAY) 1 % SOLN, Apply 2 sprays topically daily., Disp: 125 mL, Rfl: 2   Zinc Sulfate (ZINC 15 PO), Take by mouth., Disp: , Rfl:    Allergies  Allergen Reactions   Morphine And Related Nausea And Vomiting     Review of Systems  Constitutional: Negative.   Respiratory: Negative.    Cardiovascular: Negative.   Gastrointestinal: Negative.  Musculoskeletal:  Positive for arthralgias.  Skin:  Positive for color change.  Neurological: Negative.     Today's Vitals   07/27/21 1418  BP: 110/62  Pulse: 60  Temp: 98.8 F (37.1 C)  TempSrc: Oral  Weight: 133 lb (60.3 kg)  Height: 5' 3.4" (1.61 m)   Body mass index is 23.26 kg/m.  Wt Readings from Last 3 Encounters:  07/27/21 133 lb (60.3 kg)  07/27/21 133 lb (60.3 kg)  09/27/20 137 lb (62.1 kg)    Objective:  Physical Exam Constitutional:      General: She is not in acute distress.    Appearance: Normal appearance.  Cardiovascular:     Rate and Rhythm: Normal rate and regular rhythm.     Pulses: Normal pulses.     Heart sounds: Normal  heart sounds. No murmur heard. Pulmonary:     Effort: Pulmonary effort is normal. No respiratory distress.     Breath sounds: Normal breath sounds. No wheezing.  Skin:    Capillary Refill: Capillary refill takes less than 2 seconds.     Comments: Finger nails are yellowish  Neurological:     General: No focal deficit present.     Mental Status: She is alert and oriented to person, place, and time.     Cranial Nerves: No cranial nerve deficit.  Psychiatric:        Mood and Affect: Mood normal.        Behavior: Behavior normal.        Thought Content: Thought content normal.        Judgment: Judgment normal.        Assessment And Plan:     1. Mixed hyperlipidemia Comments: She continues to decline taking a statin, continue red yeast rice and CoQ10 - Lipid panel - BMP8+eGFR  2. Abnormal glucose Comments: Stable, encouraged to limit intake of sugary foods and drinks   - Hemoglobin A1c  3. Nail fungus Comments: Fingernails with yellow discoloration, will treat with Lamisil spray if not better will consider oral medication pending liver functions - Terbinafine HCl (LAMISIL AT SPRAY) 1 % SOLN; Apply 2 sprays topically daily.  Dispense: 125 mL; Refill: 2  4. Statin declined  5. Encounter for hepatitis C screening test for low risk patient Will check Hepatitis C screening due to recent recommendations to screen all adults 18 years and older     Patient was given opportunity to ask questions. Patient verbalized understanding of the plan and was able to repeat key elements of the plan. All questions were answered to their satisfaction.  Minette Brine, FNP   I, Minette Brine, FNP, have reviewed all documentation for this visit. The documentation on 07/27/21 for the exam, diagnosis, procedures, and orders are all accurate and complete.   IF YOU HAVE BEEN REFERRED TO A SPECIALIST, IT MAY TAKE 1-2 WEEKS TO SCHEDULE/PROCESS THE REFERRAL. IF YOU HAVE NOT HEARD FROM US/SPECIALIST IN TWO  WEEKS, PLEASE GIVE [place] A CALL AT [phone].   THE PATIENT IS ENCOURAGED TO PRACTICE SOCIAL DISTANCING DUE TO THE COVID-19 PANDEMIC.

## 2021-07-27 NOTE — Progress Notes (Signed)
This visit occurred during the SARS-CoV-2 public health emergency.  Safety protocols were in place, including screening questions prior to the visit, additional usage of staff PPE, and extensive cleaning of exam room while observing appropriate contact time as indicated for disinfecting solutions.  Subjective:   Sabrina Rivera is a 80 y.o. female who presents for Medicare Annual (Subsequent) preventive examination.  Review of Systems     Cardiac Risk Factors include: advanced age (>1455men, 26>65 women);dyslipidemia     Objective:    Today's Vitals   07/27/21 1435  BP: 110/62  Pulse: 60  Temp: 98.8 F (37.1 C)  TempSrc: Oral  Weight: 133 lb (60.3 kg)  Height: 5' 3.4" (1.61 m)   Body mass index is 23.26 kg/m.  Advanced Directives 07/27/2021 07/15/2020 07/15/2019 10/16/2018  Does Patient Have a Medical Advance Directive? No No No No  Would patient like information on creating a medical advance directive? No - Patient declined No - Patient declined - No - Patient declined    Current Medications (verified) Outpatient Encounter Medications as of 07/27/2021  Medication Sig   Cholecalciferol (VITAMIN D3) 1000 units CAPS Take 1 capsule by mouth daily.   co-enzyme Q-10 30 MG capsule Take 30 mg by mouth daily.   Garlic 100 MG TABS Take 1 tablet by mouth.   glucosamine-chondroitin 500-400 MG tablet Take 1 tablet by mouth daily.   lactobacillus acidophilus (BACID) TABS tablet Take 1 tablet by mouth 3 (three) times daily.   Multiple Vitamin (MULTIVITAMIN WITH MINERALS) TABS tablet Take 1 tablet by mouth daily.   Naphazoline-Pheniramine (EYE ALLERGY RELIEF OP) Apply to eye.   Red Yeast Rice 600 MG CAPS Take 1 capsule by mouth daily.   No facility-administered encounter medications on file as of 07/27/2021.    Allergies (verified) Morphine and related   History: Past Medical History:  Diagnosis Date   Hyperlipidemia    Internal hemorrhoids    Statin declined 09/01/2020   Type 2 diabetes  mellitus (HCC)    Vitamin D deficiency    Past Surgical History:  Procedure Laterality Date   DG RIGHT TIBIA AND FIBULA ([facility] HX)  1995   broke leg roller skating   TONSILLECTOMY Bilateral in second grade   Family History  Problem Relation Age of Onset   Heart disease Brother    Hypertension Father    Lung cancer Father    Stroke Mother    Hypertension Mother    Thyroid disease Mother    Social History   Socioeconomic History   Marital status: Divorced    Spouse name: Not on file   Number of children: Not on file   Years of education: Not on file   Highest education level: Not on file  Occupational History   Occupation: retired  Tobacco Use   Smoking status: Never   Smokeless tobacco: Never  Vaping Use   Vaping Use: Never used  Substance and Sexual Activity   Alcohol use: Never    Alcohol/week: 0.0 standard drinks   Drug use: Never   Sexual activity: Not Currently  Other Topics Concern   Not on file  Social History Narrative   Not on file   Social Determinants of Health   Financial Resource Strain: Low Risk    Difficulty of Paying Living Expenses: Not hard at all  Food Insecurity: No Food Insecurity   Worried About Programme researcher, broadcasting/film/videounning Out of Food in the Last Year: Never true   Ran Out of Food in the Last Year:  Never true  Transportation Needs: No Transportation Needs   Lack of Transportation (Medical): No   Lack of Transportation (Non-Medical): No  Physical Activity: Insufficiently Active   Days of Exercise per Week: 4 days   Minutes of Exercise per Session: 20 min  Stress: No Stress Concern Present   Feeling of Stress : Not at all  Social Connections: Not on file    Tobacco Counseling Counseling given: Not Answered   Clinical Intake:  Pre-visit preparation completed: Yes  Pain : No/denies pain     Nutritional Status: BMI of 19-24  Normal Nutritional Risks: None Diabetes: No  How often do you need to have someone help you when you read instructions,  pamphlets, or other written materials from your doctor or pharmacy?: 1 - Never What 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 no  Interpreter Needed?: No  Information entered by :: NAllen LPN   Activities of Daily Living In your present state of health, do you have any difficulty performing the following activities: 07/27/2021 07/27/2021  Hearing? N N  Vision? N N  Difficulty concentrating or making decisions? N N  Walking or climbing stairs? N N  Dressing or bathing? N N  Doing errands, shopping? N N  Preparing Food and eating ? N -  Using the Toilet? N -  In the past six months, have you accidently leaked urine? Y -  Do you have problems with loss of bowel control? N -  Managing your Medications? N -  Managing your Finances? N -  Housekeeping or managing your Housekeeping? N -  Some recent data might be hidden    Patient Care Team: Arnette Felts, FNP as PCP - General (General Practice)  Indicate any recent Medical Services you may have received from other than [facility] providers in the past year (date may be approximate).     Assessment:   This is a routine wellness examination for Sabrina Rivera.  Hearing/Vision screen Vision Screening - Comments:: No regular eye exams,   Dietary issues and exercise activities discussed: Current Exercise Habits: Home exercise routine, Type of exercise: walking, Time (Minutes): 20, Frequency (Times/Week): 4, Weekly Exercise (Minutes/Week): 80   Goals Addressed             This Visit's Progress    Patient Stated       07/27/2021, no goals       Depression Screen PHQ 2/9 Scores 07/27/2021 07/27/2021 07/15/2020 09/09/2019 07/15/2019 10/16/2018 10/16/2018  PHQ - 2 Score 0 0 0 0 0 0 0  PHQ- 9 Score - 0 0 - 0 - -    Fall Risk Fall Risk  07/27/2021 07/27/2021 07/15/2020 09/09/2019 07/15/2019  Falls in the past year? 0 0 0 0 0  Number falls in past yr: - 0 - - -  Injury with Fall? - 0 - - -  Risk for fall due  to : Medication side effect - No Fall Risks - -  Follow up Falls evaluation completed;Education provided;Falls prevention discussed - Falls evaluation completed;Education provided;Falls prevention discussed - Falls evaluation completed;Falls prevention discussed    FALL RISK PREVENTION PERTAINING TO THE HOME:  Any stairs in or around the home? Yes  If so, are there any without handrails? No  Home free of loose throw rugs in walkways, pet beds, electrical cords, etc? Yes  Adequate lighting in your home to reduce risk of falls? Yes   ASSISTIVE DEVICES UTILIZED TO PREVENT FALLS:  Life alert?  No  Use of a cane, walker or w/c? No  Grab bars in the bathroom? No  Shower chair or bench in shower? No  Elevated toilet seat or a handicapped toilet? No   TIMED UP AND GO:  Was the test performed? No .    Gait steady and fast without use of assistive device  Cognitive Function:     6CIT Screen 07/27/2021 07/15/2020 07/15/2019 10/16/2018  What Year? 0 points 0 points 0 points 0 points  What month? 0 points 0 points 0 points 0 points  What time? 0 points 0 points 0 points 0 points  Count back from 20 0 points 0 points 0 points 0 points  Months in reverse 0 points 0 points 0 points 0 points  Repeat phrase 8 points 0 points 0 points 0 points  Total Score 8 0 0 0    Immunizations  There is no immunization history on file for this patient.  TDAP status: Up to date  Flu Vaccine status: Declined, Education has been provided regarding the importance of this vaccine but patient still declined. Advised may receive this vaccine at local pharmacy or Health Dept. Aware to provide a copy of the vaccination record if obtained from local pharmacy or Health Dept. Verbalized acceptance and understanding.  Pneumococcal vaccine status: Declined,  Education has been provided regarding the importance of this vaccine but patient still declined. Advised may receive this vaccine at local pharmacy or Health Dept.  Aware to provide a copy of the vaccination record if obtained from local pharmacy or Health Dept. Verbalized acceptance and understanding.   Covid-19 vaccine status: Declined, Education has been provided regarding the importance of this vaccine but patient still declined. Advised may receive this vaccine at local pharmacy or Health Dept.or vaccine clinic. Aware to provide a copy of the vaccination record if obtained from local pharmacy or Health Dept. Verbalized acceptance and understanding.  Qualifies for Shingles Vaccine? Yes   Zostavax completed No   Shingrix Completed?: No.    Education has been provided regarding the importance of this vaccine. Patient has been advised to call insurance company to determine out of pocket expense if they have not yet received this vaccine. Advised may also receive vaccine at local pharmacy or Health Dept. Verbalized acceptance and understanding.  Screening Tests Health Maintenance  Topic Date Due   INFLUENZA VACCINE  07/18/2021   COVID-19 Vaccine (1) 08/12/2021 (Originally 04/19/1946)   Zoster Vaccines- Shingrix (1 of 2) 10/27/2021 (Originally 04/18/1991)   PNA vac Low Risk Adult (1 of 2 - PCV13) 07/27/2022 (Originally 04/19/2007)   TETANUS/TDAP  09/02/2023   DEXA SCAN  Completed   Hepatitis C Screening  Completed   HPV VACCINES  Aged Out    Health Maintenance  Health Maintenance Due  Topic Date Due   INFLUENZA VACCINE  07/18/2021    Colorectal cancer screening: No longer required.   Mammogram status: No longer required due to age.  Bone Density status: Completed 10/09/2018.   Lung Cancer Screening: (Low Dose CT Chest recommended if Age 29-80 years, 30 pack-year currently smoking OR have quit w/in 15years.) does not qualify.   Lung Cancer Screening Referral: no  Additional Screening:  Hepatitis C Screening: does qualify; Completed today  Vision Screening: Recommended annual ophthalmology exams for early detection of glaucoma and other  disorders of the eye. Is the patient up to date with their annual eye exam?  No  Who is the provider or what is the name of the office  in which the patient attends annual eye exams? none If pt is not established with a provider, would they like to be referred to a provider to establish care? No .   Dental Screening: Recommended annual dental exams for proper oral hygiene  Community Resource Referral / Chronic Care Management: CRR required this visit?  No   CCM required this visit?  No      Plan:     I have personally reviewed and noted the following in the patient's chart:   Medical and social history Use of alcohol, tobacco or illicit drugs  Current medications and supplements including opioid prescriptions.  Functional ability and status Nutritional status Physical activity Advanced directives List of other physicians Hospitalizations, surgeries, and ER visits in previous 12 months Vitals Screenings to include cognitive, depression, and falls Referrals and appointments  In addition, I have reviewed and discussed with patient certain preventive protocols, quality metrics, and best practice recommendations. A written personalized care plan for preventive services as well as general preventive health recommendations were provided to patient.     Barb Merino, LPN   02/08/[id number]   Nurse Notes:

## 2021-07-27 NOTE — Patient Instructions (Signed)
Sabrina Rivera , Thank you for taking time to come for your Medicare Wellness Visit. I appreciate your ongoing commitment to your health goals. Please review the following plan we discussed and let me know if I can assist you in the future.   Screening recommendations/referrals: Colonoscopy: not required Mammogram: not required Bone Density: completed 10/09/2018 Recommended yearly ophthalmology/optometry visit for glaucoma screening and checkup Recommended yearly dental visit for hygiene and checkup  Vaccinations: Influenza vaccine: decline Pneumococcal vaccine: decline Tdap vaccine: 09/01/2013, due 09/02/2023 Shingles vaccine: discussed   Covid-19:decline  Advanced directives: Advance directive discussed with you today.    Conditions/risks identified: none  Next appointment: Follow up in one year for your annual wellness visit    Preventive Care 65 Years and Older, Female Preventive care refers to lifestyle choices and visits with your health care provider that can promote health and wellness. What does preventive care include? A yearly physical exam. This is also called an annual well check. Dental exams once or twice a year. Routine eye exams. Ask your health care provider how often you should have your eyes checked. Personal lifestyle choices, including: Daily care of your teeth and gums. Regular physical activity. Eating a healthy diet. Avoiding tobacco and drug use. Limiting alcohol use. Practicing safe sex. Taking low-dose aspirin every day. Taking vitamin and mineral supplements as recommended by your health care provider. What happens during an annual well check? The services and screenings done by your health care provider during your annual well check will depend on your age, overall health, lifestyle risk factors, and family history of disease. Counseling  Your health care provider may ask you questions about your: Alcohol use. Tobacco use. Drug use. Emotional  well-being. Home and relationship well-being. Sexual activity. Eating habits. History of falls. Memory and ability to understand (cognition). Work and work Astronomer. Reproductive health. Screening  You may have the following tests or measurements: Height, weight, and BMI. Blood pressure. Lipid and cholesterol levels. These may be checked every 5 years, or more frequently if you are over 40 years old. Skin check. Lung cancer screening. You may have this screening every year starting at age 21 if you have a 30-pack-year history of smoking and currently smoke or have quit within the past 15 years. Fecal occult blood test (FOBT) of the stool. You may have this test every year starting at age 51. Flexible sigmoidoscopy or colonoscopy. You may have a sigmoidoscopy every 5 years or a colonoscopy every 10 years starting at age 85. Hepatitis C blood test. Hepatitis B blood test. Sexually transmitted disease (STD) testing. Diabetes screening. This is done by checking your blood sugar (glucose) after you have not eaten for a while (fasting). You may have this done every 1-3 years. Bone density scan. This is done to screen for osteoporosis. You may have this done starting at age 76. Mammogram. This may be done every 1-2 years. Talk to your health care provider about how often you should have regular mammograms. Talk with your health care provider about your test results, treatment options, and if necessary, the need for more tests. Vaccines  Your health care provider may recommend certain vaccines, such as: Influenza vaccine. This is recommended every year. Tetanus, diphtheria, and acellular pertussis (Tdap, Td) vaccine. You may need a Td booster every 10 years. Zoster vaccine. You may need this after age 73. Pneumococcal 13-valent conjugate (PCV13) vaccine. One dose is recommended after age 11. Pneumococcal polysaccharide (PPSV23) vaccine. One dose is recommended after age 66.  Talk to your  health care provider about which screenings and vaccines you need and how often you need them. This information is not intended to replace advice given to you by your health care provider. Make sure you discuss any questions you have with your health care provider. Document Released: 12/31/2015 Document Revised: 08/23/2016 Document Reviewed: 10/05/2015 Elsevier Interactive Patient Education  2017 [employer] Prevention in the Home Falls can cause injuries. They can happen to people of all ages. There are many things you can do to make your home safe and to help prevent falls. What can I do on the outside of my home? Regularly fix the edges of walkways and driveways and fix any cracks. Remove anything that might make you trip as you walk through a door, such as a raised step or threshold. Trim any bushes or trees on the path to your home. Use bright outdoor lighting. Clear any walking paths of anything that might make someone trip, such as rocks or tools. Regularly check to see if handrails are loose or broken. Make sure that both sides of any steps have handrails. Any raised decks and porches should have guardrails on the edges. Have any leaves, snow, or ice cleared regularly. Use sand or salt on walking paths during winter. Clean up any spills in your garage right away. This includes oil or grease spills. What can I do in the bathroom? Use night lights. Install grab bars by the toilet and in the tub and shower. Do not use towel bars as grab bars. Use non-skid mats or decals in the tub or shower. If you need to sit down in the shower, use a plastic, non-slip stool. Keep the floor dry. Clean up any water that spills on the floor as soon as it happens. Remove soap buildup in the tub or shower regularly. Attach bath mats securely with double-sided non-slip rug tape. Do not have throw rugs and other things on the floor that can make you trip. What can I do in the bedroom? Use night  lights. Make sure that you have a light by your bed that is easy to reach. Do not use any sheets or blankets that are too big for your bed. They should not hang down onto the floor. Have a firm chair that has side arms. You can use this for support while you get dressed. Do not have throw rugs and other things on the floor that can make you trip. What can I do in the kitchen? Clean up any spills right away. Avoid walking on wet floors. Keep items that you use a lot in easy-to-reach places. If you need to reach something above you, use a strong step stool that has a grab bar. Keep electrical cords out of the way. Do not use floor polish or wax that makes floors slippery. If you must use wax, use non-skid floor wax. Do not have throw rugs and other things on the floor that can make you trip. What can I do with my stairs? Do not leave any items on the stairs. Make sure that there are handrails on both sides of the stairs and use them. Fix handrails that are broken or loose. Make sure that handrails are as long as the stairways. Check any carpeting to make sure that it is firmly attached to the stairs. Fix any carpet that is loose or worn. Avoid having throw rugs at the top or bottom of the stairs. If you do have throw  rugs, attach them to the floor with carpet tape. Make sure that you have a light switch at the top of the stairs and the bottom of the stairs. If you do not have them, ask someone to add them for you. What else can I do to help prevent falls? Wear shoes that: Do not have high heels. Have rubber bottoms. Are comfortable and fit you well. Are closed at the toe. Do not wear sandals. If you use a stepladder: Make sure that it is fully opened. Do not climb a closed stepladder. Make sure that both sides of the stepladder are locked into place. Ask someone to hold it for you, if possible. Clearly mark and make sure that you can see: Any grab bars or handrails. First and last  steps. Where the edge of each step is. Use tools that help you move around (mobility aids) if they are needed. These include: Canes. Walkers. Scooters. Crutches. Turn on the lights when you go into a dark area. Replace any light bulbs as soon as they burn out. Set up your furniture so you have a clear path. Avoid moving your furniture around. If any of your floors are uneven, fix them. If there are any pets around you, be aware of where they are. Review your medicines with your doctor. Some medicines can make you feel dizzy. This can increase your chance of falling. Ask your doctor what other things that you can do to help prevent falls. This information is not intended to replace advice given to you by your health care provider. Make sure you discuss any questions you have with your health care provider. Document Released: 09/30/2009 Document Revised: 05/11/2016 Document Reviewed: 01/08/2015 Elsevier Interactive Patient Education  2017 [employer].

## 2021-07-27 NOTE — Patient Instructions (Signed)
High Cholesterol  High cholesterol is a condition in which the blood has high levels of a white, waxy substance similar to fat (cholesterol). The liver makes all the cholesterol that the body needs. The human body needs small amounts of cholesterol to help build cells. A person gets extra orexcess cholesterol from the food that he or she eats. The blood carries cholesterol from the liver to the rest of the body. If you have high cholesterol, deposits (plaques) may build up on the walls of your arteries. Arteries are the blood vessels that carry blood away from your heart. These plaques make the arteries narrowand stiff. Cholesterol plaques increase your risk for heart attack and stroke. Work withyour health care provider to keep your cholesterol levels in a healthy range. What increases the risk? The following factors may make you more likely to develop this condition: Eating foods that are high in animal fat (saturated fat) or cholesterol. Being overweight. Not getting enough exercise. A family history of high cholesterol (familial hypercholesterolemia). Use of tobacco products. Having diabetes. What are the signs or symptoms? There are no symptoms of this condition. How is this diagnosed? This condition may be diagnosed based on the results of a blood test. If you are older than 79 years of age, your health care provider may check your cholesterol levels every 4-6 years. You may be checked more often if you have high cholesterol or other risk factors for heart disease. The blood test for cholesterol measures: "Bad" cholesterol, or LDL cholesterol. This is the main type of cholesterol that causes heart disease. The desired level is less than 100 mg/dL. "Good" cholesterol, or HDL cholesterol. HDL helps protect against heart disease by cleaning the arteries and carrying the LDL to the liver for processing. The desired level for HDL is 60 mg/dL or higher. Triglycerides. These are fats that your  body can store or burn for energy. The desired level is less than 150 mg/dL. Total cholesterol. This measures the total amount of cholesterol in your blood and includes LDL, HDL, and triglycerides. The desired level is less than 200 mg/dL. How is this treated? This condition may be treated with: Diet changes. You may be asked to eat foods that have more fiber and less saturated fats or added sugar. Lifestyle changes. These may include regular exercise, maintaining a healthy weight, and quitting use of tobacco products. Medicines. These are given when diet and lifestyle changes have not worked. You may be prescribed a statin medicine to help lower your cholesterol levels. Follow these instructions at home: Eating and drinking  Eat a healthy, balanced diet. This diet includes: Daily servings of a variety of fresh, frozen, or canned fruits and vegetables. Daily servings of whole grain foods that are rich in fiber. Foods that are low in saturated fats and trans fats. These include poultry and fish without skin, lean cuts of meat, and low-fat dairy products. A variety of fish, especially oily fish that contain omega-3 fatty acids. Aim to eat fish at least 2 times a week. Avoid foods and drinks that have added sugar. Use healthy cooking methods, such as roasting, grilling, broiling, baking, poaching, steaming, and stir-frying. Do not fry your food except for stir-frying.  Lifestyle  Get regular exercise. Aim to exercise for a total of 150 minutes a week. Increase your activity level by doing activities such as gardening, walking, and taking the stairs. Do not use any products that contain nicotine or tobacco, such as cigarettes, e-cigarettes, and chewing tobacco.   If you need help quitting, ask your health care provider.  General instructions Take over-the-counter and prescription medicines only as told by your health care provider. Keep all follow-up visits as told by your health care provider.  This is important. Where to find more information American Heart Association: www.heart.org National Heart, Lung, and Blood Institute: www.nhlbi.nih.gov Contact a health care provider if: You have trouble achieving or maintaining a healthy diet or weight. You are starting an exercise program. You are unable to stop smoking. Get help right away if: You have chest pain. You have trouble breathing. You have any symptoms of a stroke. "BE FAST" is an easy way to remember the main warning signs of a stroke: B - Balance. Signs are dizziness, sudden trouble walking, or loss of balance. E - Eyes. Signs are trouble seeing or a sudden change in vision. F - Face. Signs are sudden weakness or numbness of the face, or the face or eyelid drooping on one side. A - Arms. Signs are weakness or numbness in an arm. This happens suddenly and usually on one side of the body. S - Speech. Signs are sudden trouble speaking, slurred speech, or trouble understanding what people say. T - Time. Time to call emergency services. Write down what time symptoms started. You have other signs of a stroke, such as: A sudden, severe headache with no known cause. Nausea or vomiting. Seizure. These symptoms may represent a serious problem that is an emergency. Do not wait to see if the symptoms will go away. Get medical help right away. Call your local emergency services (911 in the [place] Do not drive yourself to the hospital. Summary Cholesterol plaques increase your risk for heart attack and stroke. Work with your health care provider to keep your cholesterol levels in a healthy range. Eat a healthy, balanced diet, get regular exercise, and maintain a healthy weight. Do not use any products that contain nicotine or tobacco, such as cigarettes, e-cigarettes, and chewing tobacco. Get help right away if you have any symptoms of a stroke. This information is not intended to replace advice given to you by your health care  provider. Make sure you discuss any questions you have with your healthcare provider. Document Revised: 11/03/2019 Document Reviewed: 11/03/2019 Elsevier Patient Education  2022 [employer].  

## 2021-07-28 LAB — LIPID PANEL
Chol/HDL Ratio: 2.9 ratio (ref 0.0–4.4)
Cholesterol, Total: 266 mg/dL — ABNORMAL HIGH (ref 100–199)
HDL: 92 mg/dL (ref >39)
LDL Chol Calc (NIH): 143 mg/dL — ABNORMAL HIGH (ref 0–99)
Triglycerides: 178 mg/dL — ABNORMAL HIGH (ref 0–149)
VLDL Cholesterol Cal: 31 mg/dL (ref 5–40)

## 2021-07-28 LAB — HEMOGLOBIN A1C
Est. average glucose Bld gHb Est-mCnc: 134 mg/dL
Hgb A1c MFr Bld: 6.3 % — ABNORMAL HIGH (ref 4.8–5.6)

## 2021-07-28 LAB — BMP8+EGFR
BUN/Creatinine Ratio: 19 (ref 12–28)
BUN: 16 mg/dL (ref 8–27)
CO2: 24 mmol/L (ref 20–29)
Calcium: 9.1 mg/dL (ref 8.7–10.3)
Chloride: 105 mmol/L (ref 96–106)
Creatinine, Ser: 0.84 mg/dL (ref 0.57–1.00)
Glucose: 91 mg/dL (ref 65–99)
Potassium: 4.6 mmol/L (ref 3.5–5.2)
Sodium: 144 mmol/L (ref 134–144)
eGFR: 71 mL/min/{1.73_m2} (ref >59)

## 2021-09-09 DIAGNOSIS — Z823 Family history of stroke: Secondary | ICD-10-CM | POA: Diagnosis not present

## 2021-09-09 DIAGNOSIS — Z885 Allergy status to narcotic agent status: Secondary | ICD-10-CM | POA: Diagnosis not present

## 2021-09-09 DIAGNOSIS — Z87891 Personal history of nicotine dependence: Secondary | ICD-10-CM | POA: Diagnosis not present

## 2021-09-19 ENCOUNTER — Telehealth: Payer: Self-pay | Admitting: Nurse Practitioner

## 2021-09-19 NOTE — Telephone Encounter (Signed)
Returned call to patient who was trying to change her appt on 10/03/2021 for her physical to earlier. There are not any available appts for her physical she is having back pain that is radiating down her leg, offered to have her to come in for a visit to evaluate further but she would like to wait. She is advised can take tylenol or ibuprofen for 3-5 days and rest her back/stretch. If not better she needs to schedule an appt. She can also go to the urgent care if worsens.

## 2021-10-03 ENCOUNTER — Other Ambulatory Visit: Payer: Self-pay

## 2021-10-03 ENCOUNTER — Encounter: Payer: Self-pay | Admitting: Nurse Practitioner

## 2021-10-03 ENCOUNTER — Ambulatory Visit (INDEPENDENT_AMBULATORY_CARE_PROVIDER_SITE_OTHER): Payer: Medicare PPO | Admitting: Nurse Practitioner

## 2021-10-03 VITALS — BP 130/60 | HR 64 | Temp 98.1°F | Ht 63.8 in | Wt 130.6 lb

## 2021-10-03 DIAGNOSIS — Z5320 Procedure and treatment not carried out because of patient's decision for unspecified reasons: Secondary | ICD-10-CM

## 2021-10-03 DIAGNOSIS — L819 Disorder of pigmentation, unspecified: Secondary | ICD-10-CM | POA: Diagnosis not present

## 2021-10-03 DIAGNOSIS — E782 Mixed hyperlipidemia: Secondary | ICD-10-CM

## 2021-10-03 DIAGNOSIS — M5441 Lumbago with sciatica, right side: Secondary | ICD-10-CM | POA: Diagnosis not present

## 2021-10-03 DIAGNOSIS — Z79899 Other long term (current) drug therapy: Secondary | ICD-10-CM | POA: Diagnosis not present

## 2021-10-03 DIAGNOSIS — Z0000 Encounter for general adult medical examination without abnormal findings: Secondary | ICD-10-CM | POA: Diagnosis not present

## 2021-10-03 DIAGNOSIS — Z2821 Immunization not carried out because of patient refusal: Secondary | ICD-10-CM | POA: Diagnosis not present

## 2021-10-03 NOTE — Progress Notes (Signed)
I,Tianna Badgett,acting as a Neurosurgeon for [employer], FNP.,have documented all relevant documentation on the behalf of Arnette Felts, FNP,as directed by  Arnette Felts, FNP while in the presence of Arnette Felts, FNP.  This visit occurred during the SARS-CoV-2 public health emergency.  Safety protocols were in place, including screening questions prior to the visit, additional usage of staff PPE, and extensive cleaning of exam room while observing appropriate contact time as indicated for disinfecting solutions.  Subjective:     Patient ID: Sabrina Rivera , female    DOB: 1941/07/14 , 80 y.o.   MRN: [id number]   Chief Complaint  Patient presents with   Annual Exam    HPI  Here for HM. She has [facility], it has been 2 years and she was going for a PAP which was not done.     Past Medical History:  Diagnosis Date   Hyperlipidemia    Internal hemorrhoids    Statin declined 09/01/2020   Type 2 diabetes mellitus (HCC)    Vitamin D deficiency      Family History  Problem Relation Age of Onset   Heart disease Brother    Hypertension Father    Lung cancer Father    Stroke Mother    Hypertension Mother    Thyroid disease Mother      Current Outpatient Medications:    Ascorbic Acid (VITAMIN C) 100 MG tablet, Take 100 mg by mouth daily., Disp: , Rfl:    Cholecalciferol (VITAMIN D3) 1000 units CAPS, Take 1 capsule by mouth daily., Disp: , Rfl:    co-enzyme Q-10 30 MG capsule, Take 30 mg by mouth daily., Disp: , Rfl:    Garlic 100 MG TABS, Take 1 tablet by mouth., Disp: , Rfl:    glucosamine-chondroitin 500-400 MG tablet, Take 1 tablet by mouth daily., Disp: , Rfl:    lactobacillus acidophilus (BACID) TABS tablet, Take 1 tablet by mouth 3 (three) times daily., Disp: , Rfl:    Multiple Vitamin (MULTIVITAMIN WITH MINERALS) TABS tablet, Take 1 tablet by mouth daily., Disp: , Rfl:    Naphazoline-Pheniramine (EYE ALLERGY RELIEF OP), Apply to eye., Disp: , Rfl:     Red Yeast Rice 600 MG CAPS, Take 1 capsule by mouth daily., Disp: , Rfl:    Terbinafine HCl (LAMISIL AT SPRAY) 1 % SOLN, Apply 2 sprays topically daily., Disp: 125 mL, Rfl: 2   Zinc Sulfate (ZINC 15 PO), Take by mouth., Disp: , Rfl:    Allergies  Allergen Reactions   Morphine And Related Nausea And Vomiting      The patient states she uses post menopausal status for birth control. Last LMP was No LMP recorded. Patient is postmenopausal.. Negative for Dysmenorrhea and Negative for Menorrhagia. Negative for: breast discharge, breast lump(s), breast pain and breast self exam. Associated symptoms include abnormal vaginal bleeding. Pertinent negatives include abnormal bleeding (hematology), anxiety, decreased libido, depression, difficulty falling sleep, dyspareunia, history of infertility, nocturia, sexual dysfunction, sleep disturbances, urinary incontinence, urinary urgency, vaginal discharge and vaginal itching. Diet regular.The patient states her exercise level is minimal - she has gone bowling last week. She has been using ibuprofen and tylenol arthritis for her pain to her back radiating x right leg.   The patient's tobacco use is:  Social History   Tobacco Use  Smoking Status Never  Smokeless Tobacco Never   She has been exposed to passive smoke. The patient's alcohol use is:  Social History  Substance and Sexual Activity  Alcohol Use Never   Alcohol/week: 0.0 standard drinks    Review of Systems  Constitutional: Negative.   HENT: Negative.    Eyes: Negative.   Respiratory: Negative.    Cardiovascular: Negative.  Negative for chest pain, palpitations and leg swelling.  Gastrointestinal: Negative.   Endocrine: Negative.   Genitourinary: Negative.   Musculoskeletal:  Positive for back pain.  Skin: Negative.   Allergic/Immunologic: Negative.   Neurological: Negative.   Hematological: Negative.   Psychiatric/Behavioral: Negative.      Today's Vitals   10/03/21 0902  BP:  130/60  Pulse: 64  Temp: 98.1 F (36.7 C)  TempSrc: Oral  Weight: 130 lb 9.6 oz (59.2 kg)  Height: 5' 3.8" (1.621 m)   Body mass index is 22.56 kg/m.  Wt Readings from Last 3 Encounters:  10/03/21 130 lb 9.6 oz (59.2 kg)  07/27/21 133 lb (60.3 kg)  07/27/21 133 lb (60.3 kg)    Objective:  Physical Exam Vitals reviewed.  Constitutional:      General: She is not in acute distress.    Appearance: Normal appearance. She is well-developed.  HENT:     Head: Normocephalic and atraumatic.     Right Ear: Hearing, tympanic membrane, ear canal and external ear normal. There is no impacted cerumen.     Left Ear: Hearing, tympanic membrane, ear canal and external ear normal. There is no impacted cerumen.     Nose:     Comments: Deferred - masked    Mouth/Throat:     Comments: Deferred - masked Eyes:     General: Lids are normal.     Extraocular Movements: Extraocular movements intact.     Conjunctiva/sclera: Conjunctivae normal.     Pupils: Pupils are equal, round, and reactive to light.     Funduscopic exam:    Right eye: No papilledema.        Left eye: No papilledema.  Neck:     Thyroid: No thyroid mass.     Vascular: No carotid bruit.  Cardiovascular:     Rate and Rhythm: Normal rate and regular rhythm.     Pulses:          Carotid pulses are 2+ on the right side and 2+ on the left side.      Popliteal pulses are 1+ on the right side and 1+ on the left side.     Heart sounds: Murmur heard.  Pulmonary:     Effort: Pulmonary effort is normal. No respiratory distress.     Breath sounds: Normal breath sounds. No wheezing.  Abdominal:     General: Abdomen is flat. Bowel sounds are normal. There is no distension.     Palpations: Abdomen is soft.     Tenderness: There is no abdominal tenderness.  Genitourinary:    Rectum: Guaiac result negative.  Musculoskeletal:        General: No swelling or tenderness. Normal range of motion.     Cervical back: Full passive range of  motion without pain, normal range of motion and neck supple.     Right lower leg: Normal. No edema.     Left lower leg: Normal. No edema.  Skin:    General: Skin is warm and dry.     Capillary Refill: Capillary refill takes less than 2 seconds.  Neurological:     General: No focal deficit present.     Mental Status: She is alert and oriented to person, place, and time.  Cranial Nerves: No cranial nerve deficit.     Sensory: No sensory deficit.     Motor: No weakness.  Psychiatric:        Mood and Affect: Mood normal.        Behavior: Behavior normal.        Thought Content: Thought content normal.        Judgment: Judgment normal.        Assessment And Plan:     1. Encounter for general adult medical examination w/o abnormal findings Behavior modifications discussed and diet history reviewed.   Pt will continue to exercise regularly and modify diet with low GI, plant based foods and decrease intake of processed foods.  Recommend intake of daily multivitamin, Vitamin D, and calcium.  Recommend for preventive screenings, as well as recommend immunizations that include influenza, TDAP, and Shingles (decline)  2. Mixed hyperlipidemia Comments: She declines statin therapy, cholesterol levels were improved at last visit.  Continue low fat diet  3. Acute right-sided low back pain with right-sided sciatica Encouraged to stretch regularly and to continue with tylenol as needed Offered steroid dose pack or injection declines at this time, feels like she is doing better. Given stretching exercises.  4. Statin declined Aware of risk of elevated cholesterol.  5. Discoloration of skin of lower leg Diminished pedal pulses and has slightly splotchy skin  - VAS [place] LOWER EXTREMITY VENOUS REFLUX; Future  6. COVID-19 vaccination declined Declines covid 19 vaccine. Discussed risk of covid 50 and if she changes her mind about the vaccine to call the office.  Encouraged to take multivitamin,  vitamin d, vitamin c and zinc to increase immune system. Aware can call office if would like to have vaccine here at office.    7. Influenza vaccination declined Explained risk of not taking influenza vaccine. Encouraged to take vitamin c and zinc to help boost immune system  8. Other long term (current) drug therapy     Patient was given opportunity to ask questions. Patient verbalized understanding of the plan and was able to repeat key elements of the plan. All questions were answered to their satisfaction.   Arnette Felts, FNP   I, Arnette Felts, FNP, have reviewed all documentation for this visit. The documentation on 10/03/21 for the exam, diagnosis, procedures, and orders are all accurate and complete.  THE PATIENT IS ENCOURAGED TO PRACTICE SOCIAL DISTANCING DUE TO THE COVID-19 PANDEMIC.

## 2021-10-03 NOTE — Patient Instructions (Addendum)
Health Maintenance, Female Adopting a healthy lifestyle and getting preventive care are important in promoting health and wellness. Ask your health care provider about: The right schedule for you to have regular tests and exams. Things you can do on your own to prevent diseases and keep yourself healthy. What should I know about diet, weight, and exercise? Eat a healthy diet  Eat a diet that includes plenty of vegetables, fruits, low-fat dairy products, and lean protein. Do not eat a lot of foods that are high in solid fats, added sugars, or sodium. Maintain a healthy weight Body mass index (BMI) is used to identify weight problems. It estimates body fat based on height and weight. Your health care provider can help determine your BMI and help you achieve or maintain a healthy weight. Get regular exercise Get regular exercise. This is one of the most important things you can do for your health. Most adults should: Exercise for at least 150 minutes each week. The exercise should increase your heart rate and make you sweat (moderate-intensity exercise). Do strengthening exercises at least twice a week. This is in addition to the moderate-intensity exercise. Spend less time sitting. Even light physical activity can be beneficial. Watch cholesterol and blood lipids Have your blood tested for lipids and cholesterol at 79 years of age, then have this test every 5 years. Have your cholesterol levels checked more often if: Your lipid or cholesterol levels are high. You are older than 79 years of age. You are at high risk for heart disease. What should I know about cancer screening? Depending on your health history and family history, you may need to have cancer screening at various ages. This may include screening for: Breast cancer. Cervical cancer. Colorectal cancer. Skin cancer. Lung cancer. What should I know about heart disease, diabetes, and high blood pressure? Blood pressure and heart  disease High blood pressure causes heart disease and increases the risk of stroke. This is more likely to develop in people who have high blood pressure readings, are of African descent, or are overweight. Have your blood pressure checked: Every 3-5 years if you are 18-39 years of age. Every year if you are 40 years old or older. Diabetes Have regular diabetes screenings. This checks your fasting blood sugar level. Have the screening done: Once every three years after age 40 if you are at a normal weight and have a low risk for diabetes. More often and at a younger age if you are overweight or have a high risk for diabetes. What should I know about preventing infection? Hepatitis B If you have a higher risk for hepatitis B, you should be screened for this virus. Talk with your health care provider to find out if you are at risk for hepatitis B infection. Hepatitis C Testing is recommended for: Everyone born from 1945 through 1965. Anyone with known risk factors for hepatitis C. Sexually transmitted infections (STIs) Get screened for STIs, including gonorrhea and chlamydia, if: You are sexually active and are younger than 79 years of age. You are older than 79 years of age and your health care provider tells you that you are at risk for this type of infection. Your sexual activity has changed since you were last screened, and you are at increased risk for chlamydia or gonorrhea. Ask your health care provider if you are at risk. Ask your health care provider about whether you are at high risk for HIV. Your health care provider may recommend a prescription medicine   to help prevent HIV infection. If you choose to take medicine to prevent HIV, you should first get tested for HIV. You should then be tested every 3 months for as long as you are taking the medicine. Pregnancy If you are about to stop having your period (premenopausal) and you may become pregnant, seek counseling before you get  pregnant. Take 400 to 800 micrograms (mcg) of folic acid every day if you become pregnant. Ask for birth control (contraception) if you want to prevent pregnancy. Osteoporosis and menopause Osteoporosis is a disease in which the bones lose minerals and strength with aging. This can result in bone fractures. If you are 87 years old or older, or if you are at risk for osteoporosis and fractures, ask your health care provider if you should: Be screened for bone loss. Take a calcium or vitamin D supplement to lower your risk of fractures. Be given hormone replacement therapy (HRT) to treat symptoms of menopause. Follow these instructions at home: Lifestyle Do not use any products that contain nicotine or tobacco, such as cigarettes, e-cigarettes, and chewing tobacco. If you need help quitting, ask your health care provider. Do not use street drugs. Do not share needles. Ask your health care provider for help if you need support or information about quitting drugs. Alcohol use Do not drink alcohol if: Your health care provider tells you not to drink. You are pregnant, may be pregnant, or are planning to become pregnant. If you drink alcohol: Limit how much you use to 0-1 drink a day. Limit intake if you are breastfeeding. Be aware of how much alcohol is in your drink. In the [place] one drink equals one 12 oz bottle of beer (355 mL), one 5 oz glass of wine (148 mL), or one 1 oz glass of hard liquor (44 mL). General instructions Schedule regular health, dental, and eye exams. Stay current with your vaccines. Tell your health care provider if: You often feel depressed. You have ever been abused or do not feel safe at home. Summary Adopting a healthy lifestyle and getting preventive care are important in promoting health and wellness. Follow your health care provider's instructions about healthy diet, exercising, and getting tested or screened for diseases. Follow your health care provider's  instructions on monitoring your cholesterol and blood pressure. This information is not intended to replace advice given to you by your health care provider. Make sure you discuss any questions you have with your health care provider. Document Revised: 02/11/2021 Document Reviewed: 11/27/2018 [employer] Patient Education  2022 [employer]   Sciatica Rehab Ask your health care provider which exercises are safe for you. Do exercises exactly as told by your health care provider and adjust them as directed. It is normal to feel mild stretching, pulling, tightness, or discomfort as you do these exercises. Stop right away if you feel sudden pain or your pain gets worse. Do not begin these exercises until told by your health care provider. Stretching and range-of-motion exercises These exercises warm up your muscles and joints and improve the movement and flexibility of your hips and back. These exercises also help to relieve pain, numbness, and tingling. Sciatic nerve glide Sit in a chair with your head facing down toward your chest. Place your hands behind your back. Let your shoulders slump forward. Slowly straighten one of your legs while you tilt your head back as if you are looking toward the ceiling. Only straighten your leg as far as you can without making your  symptoms worse. Hold this position for __________ seconds. Slowly return to the starting position. Repeat with your other leg. Repeat __________ times. Complete this exercise __________ times a day. Knee to chest with hip adduction and internal rotation  Lie on your back on a firm surface with both legs straight. Bend one of your knees and move it up toward your chest until you feel a gentle stretch in your lower back and buttock. Then, move your knee toward the shoulder that is on the opposite side from your leg. This is hip adduction and internal rotation. Hold your leg in this position by holding on to the front of your knee. Hold  this position for __________ seconds. Slowly return to the starting position. Repeat with your other leg. Repeat __________ times. Complete this exercise __________ times a day. Prone extension on elbows  Lie on your abdomen on a firm surface. A bed may be too soft for this exercise. Prop yourself up on your elbows. Use your arms to help lift your chest up until you feel a gentle stretch in your abdomen and your lower back. This will place some of your body weight on your elbows. If this is uncomfortable, try stacking pillows under your chest. Your hips should stay down, against the surface that you are lying on. Keep your hip and back muscles relaxed. Hold this position for __________ seconds. Slowly relax your upper body and return to the starting position. Repeat __________ times. Complete this exercise __________ times a day. Strengthening exercises These exercises build strength and endurance in your back. Endurance is the ability to use your muscles for a long time, even after they get tired. Pelvic tilt This exercise strengthens the muscles that lie deep in the abdomen. Lie on your back on a firm surface. Bend your knees and keep your feet flat on the floor. Tense your abdominal muscles. Tip your pelvis up toward the ceiling and flatten your lower back into the floor. To help with this exercise, you may place a small towel under your lower back and try to push your back into the towel. Hold this position for __________ seconds. Let your muscles relax completely before you repeat this exercise. Repeat __________ times. Complete this exercise __________ times a day. Alternating arm and leg raises  Get on your hands and knees on a firm surface. If you are on a hard floor, you may want to use padding, such as an exercise mat, to cushion your knees. Line up your arms and legs. Your hands should be directly below your shoulders, and your knees should be directly below your hips. Lift  your left leg behind you. At the same time, raise your right arm and straighten it in front of you. Do not lift your leg higher than your hip. Do not lift your arm higher than your shoulder. Keep your abdominal and back muscles tight. Keep your hips facing the ground. Do not arch your back. Keep your balance carefully, and do not hold your breath. Hold this position for __________ seconds. Slowly return to the starting position. Repeat with your right leg and your left arm. Repeat __________ times. Complete this exercise __________ times a day. Posture and body mechanics Good posture and healthy body mechanics can help to relieve stress in your body's tissues and joints. Body mechanics refers to the movements and positions of your body while you do your daily activities. Posture is part of body mechanics. Good posture means: Your spine is in its  natural S-curve position (neutral). Your shoulders are pulled back slightly. Your head is not tipped forward. Follow these guidelines to improve your posture and body mechanics in your everyday activities. Standing  When standing, keep your spine neutral and your feet about hip width apart. Keep a slight bend in your knees. Your ears, shoulders, and hips should line up. When you do a task in which you stand in one place for a long time, place one foot up on a stable object that is 2-4 inches (5-10 cm) high, such as a footstool. This helps keep your spine neutral. Sitting  When sitting, keep your spine neutral and keep your feet flat on the floor. Use a footrest, if necessary, and keep your thighs parallel to the floor. Avoid rounding your shoulders, and avoid tilting your head forward. When working at a desk or a computer, keep your desk at a height where your hands are slightly lower than your elbows. Slide your chair under your desk so you are close enough to maintain good posture. When working at a computer, place your monitor at a height where  you are looking straight ahead and you do not have to tilt your head forward or downward to look at the screen. Resting When lying down and resting, avoid positions that are most painful for you. If you have pain with activities such as sitting, bending, stooping, or squatting, lie in a position in which your body does not bend very much. For example, avoid curling up on your side with your arms and knees near your chest (fetal position). If you have pain with activities such as standing for a long time or reaching with your arms, lie with your spine in a neutral position and bend your knees slightly. Try the following positions: Lying on your side with a pillow between your knees. Lying on your back with a pillow under your knees. Lifting  When lifting objects, keep your feet at least shoulder width apart and tighten your abdominal muscles. Bend your knees and hips and keep your spine neutral. It is important to lift using the strength of your legs, not your back. Do not lock your knees straight out. Always ask for help to lift heavy or awkward objects. This information is not intended to replace advice given to you by your health care provider. Make sure you discuss any questions you have with your health care provider. Document Revised: 03/28/2019 Document Reviewed: 12/26/2018 [employer] Patient Education  2022 [employer].

## 2021-10-13 ENCOUNTER — Ambulatory Visit (HOSPITAL_COMMUNITY)
Admission: RE | Admit: 2021-10-13 | Discharge: 2021-10-13 | Disposition: A | Discharge status: Home / Self Care | Payer: Medicare PPO | Source: Ambulatory Visit | Attending: Nurse Practitioner | Admitting: Nurse Practitioner

## 2021-10-13 ENCOUNTER — Other Ambulatory Visit: Payer: Self-pay

## 2021-10-13 DIAGNOSIS — L819 Disorder of pigmentation, unspecified: Secondary | ICD-10-CM | POA: Diagnosis not present

## 2022-04-04 ENCOUNTER — Ambulatory Visit: Payer: Medicare PPO | Admitting: Nurse Practitioner

## 2022-04-17 DEATH — deceased

## 2022-07-18 DIAGNOSIS — Z87891 Personal history of nicotine dependence: Secondary | ICD-10-CM | POA: Diagnosis not present

## 2022-07-18 DIAGNOSIS — E785 Hyperlipidemia, unspecified: Secondary | ICD-10-CM | POA: Diagnosis not present

## 2022-07-18 DIAGNOSIS — Z823 Family history of stroke: Secondary | ICD-10-CM | POA: Diagnosis not present

## 2022-07-18 DIAGNOSIS — E119 Type 2 diabetes mellitus without complications: Secondary | ICD-10-CM | POA: Diagnosis not present

## 2022-07-18 DIAGNOSIS — Z809 Family history of malignant neoplasm, unspecified: Secondary | ICD-10-CM | POA: Diagnosis not present

## 2022-08-23 ENCOUNTER — Ambulatory Visit: Payer: Medicare PPO | Admitting: Nurse Practitioner

## 2022-08-23 ENCOUNTER — Ambulatory Visit (INDEPENDENT_AMBULATORY_CARE_PROVIDER_SITE_OTHER): Payer: Medicare PPO

## 2022-08-23 VITALS — Ht 64.0 in | Wt 129.0 lb

## 2022-08-23 DIAGNOSIS — Z0000 Encounter for general adult medical examination without abnormal findings: Secondary | ICD-10-CM | POA: Diagnosis not present

## 2022-08-23 NOTE — Progress Notes (Signed)
I connected with Sabrina Rivera today by telephone and verified that I am speaking with the correct person using two identifiers. Location patient: home Location provider: work Persons participating in the virtual visit: [place], Harrell LPN.   I discussed the limitations, risks, security and privacy concerns of performing an evaluation and management service by telephone and the availability of in person appointments. I also discussed with the patient that there may be a patient responsible charge related to this service. The patient expressed understanding and verbally consented to this telephonic visit.    Interactive audio and video telecommunications were attempted between this provider and patient, however failed, due to patient having technical difficulties OR patient did not have access to video capability.  We continued and completed visit with audio only.     Vital signs may be patient reported or missing.  Subjective:   Sabrina Rivera is a 81 y.o. female who presents for Medicare Annual (Subsequent) preventive examination.  Review of Systems     Cardiac Risk Factors include: advanced age (>27men, >32 women);dyslipidemia     Objective:    Today's Vitals   08/23/22 1446  Weight: 129 lb (58.5 kg)  Height: 5\' 4"  (1.626 m)   Body mass index is 22.14 kg/m.     08/23/2022    2:51 PM 07/27/2021    2:54 PM 07/15/2020    8:49 AM 07/15/2019    8:45 AM 10/16/2018   11:10 AM  Advanced Directives  Does Patient Have a Medical Advance Directive? No No No No No  Would patient like information on creating a medical advance directive?  No - Patient declined No - Patient declined  No - Patient declined    Current Medications (verified) Outpatient Encounter Medications as of 08/23/2022  Medication Sig   Ascorbic Acid (VITAMIN C) 100 MG tablet Take 100 mg by mouth daily.   Cholecalciferol (VITAMIN D3) 1000 units CAPS Take 1 capsule by mouth daily.   co-enzyme Q-10 30 MG capsule  Take 30 mg by mouth daily.   Garlic 100 MG TABS Take 1 tablet by mouth.   glucosamine-chondroitin 500-400 MG tablet Take 1 tablet by mouth daily.   Multiple Vitamin (MULTIVITAMIN WITH MINERALS) TABS tablet Take 1 tablet by mouth daily.   Naphazoline-Pheniramine (EYE ALLERGY RELIEF OP) Apply to eye.   Red Yeast Rice 600 MG CAPS Take 1 capsule by mouth daily.   Terbinafine HCl (LAMISIL AT SPRAY) 1 % SOLN Apply 2 sprays topically daily.   Zinc Sulfate (ZINC 15 PO) Take by mouth.   lactobacillus acidophilus (BACID) TABS tablet Take 1 tablet by mouth 3 (three) times daily. (Patient not taking: Reported on 08/23/2022)   No facility-administered encounter medications on file as of 08/23/2022.    Allergies (verified) Morphine and related   History: Past Medical History:  Diagnosis Date   Hyperlipidemia    Internal hemorrhoids    Statin declined 09/01/2020   Type 2 diabetes mellitus (HCC)    Vitamin D deficiency    Past Surgical History:  Procedure Laterality Date   DG RIGHT TIBIA AND FIBULA ([facility] HX)  1995   broke leg roller skating   TONSILLECTOMY Bilateral in second grade   Family History  Problem Relation Age of Onset   Heart disease Brother    Hypertension Father    Lung cancer Father    Stroke Mother    Hypertension Mother    Thyroid disease Mother    Social History   Socioeconomic History  Marital status: Divorced    Spouse name: Not on file   Number of children: Not on file   Years of education: Not on file   Highest education level: Not on file  Occupational History   Occupation: retired  Tobacco Use   Smoking status: Never   Smokeless tobacco: Never  Vaping Use   Vaping Use: Never used  Substance and Sexual Activity   Alcohol use: Never    Alcohol/week: 0.0 standard drinks of alcohol   Drug use: Never   Sexual activity: Not Currently  Other Topics Concern   Not on file  Social History Narrative   Not on file   Social Determinants of Health   Financial  Resource Strain: Low Risk  (08/23/2022)   Overall Financial Resource Strain (CARDIA)    Difficulty of Paying Living Expenses: Not hard at all  Food Insecurity: No Food Insecurity (08/23/2022)   Hunger Vital Sign    Worried About Running Out of Food in the Last Year: Never true    Ran Out of Food in the Last Year: Never true  Transportation Needs: No Transportation Needs (08/23/2022)   PRAPARE - Administrator, Civil Service (Medical): No    Lack of Transportation (Non-Medical): No  Physical Activity: Insufficiently Active (08/23/2022)   Exercise Vital Sign    Days of Exercise per Week: 3 days    Minutes of Exercise per Session: 30 min  Stress: No Stress Concern Present (08/23/2022)   [employer] of Occupational Health - Occupational Stress Questionnaire    Feeling of Stress : Not at all  Social Connections: Not on file    Tobacco Counseling Counseling given: Not Answered   Clinical Intake:  Pre-visit preparation completed: Yes  Pain : No/denies pain     Nutritional Status: BMI of 19-24  Normal Nutritional Risks: Nausea/ vomitting/ diarrhea (nausea yesterday) Diabetes: No  How often do you need to have someone help you when you read instructions, pamphlets, or other written materials from your doctor or pharmacy?: 1 - Never  Diabetic? no  Interpreter Needed?: No  Information entered by :: NAllen LPN   Activities of Daily Living    08/23/2022    2:52 PM  In your present state of health, do you have any difficulty performing the following activities:  Hearing? 1  Comment trouble with understanding sometimes  Vision? 0  Difficulty concentrating or making decisions? 0  Walking or climbing stairs? 0  Dressing or bathing? 0  Doing errands, shopping? 0  Preparing Food and eating ? N  Using the Toilet? N  In the past six months, have you accidently leaked urine? N  Do you have problems with loss of bowel control? N  Managing your Medications? N  Managing  your Finances? N  Housekeeping or managing your Housekeeping? N    Patient Care Team: Arnette Felts, FNP as PCP - General (General Practice)  Indicate any recent Medical Services you may have received from other than [facility] providers in the past year (date may be approximate).     Assessment:   This is a routine wellness examination for Sabrina Rivera.  Hearing/Vision screen Vision Screening - Comments:: No regular eye exams, Dr. Hubbard Robinson  Dietary issues and exercise activities discussed: Current Exercise Habits: Home exercise routine, Type of exercise: walking, Time (Minutes): 30, Frequency (Times/Week): 3, Weekly Exercise (Minutes/Week): 90   Goals Addressed             This Visit's Progress    Patient  Stated       08/23/2022, no goals       Depression Screen    08/23/2022    2:52 PM 07/27/2021    2:55 PM 07/27/2021    2:15 PM 07/15/2020    8:50 AM 09/09/2019   10:05 AM 07/15/2019    8:45 AM 10/16/2018   11:11 AM  PHQ 2/9 Scores  PHQ - 2 Score 0 0 0 0 0 0 0  PHQ- 9 Score   0 0  0     Fall Risk    08/23/2022    2:52 PM 07/27/2021    2:55 PM 07/27/2021    2:14 PM 07/15/2020    8:50 AM 09/09/2019   10:05 AM  Fall Risk   Falls in the past year? 0 0 0 0 0  Number falls in past yr: 0  0    Injury with Fall? 0  0    Risk for fall due to : No Fall Risks Medication side effect  No Fall Risks   Follow up Falls prevention discussed;Education provided;Falls evaluation completed Falls evaluation completed;Education provided;Falls prevention discussed  Falls evaluation completed;Education provided;Falls prevention discussed     FALL RISK PREVENTION PERTAINING TO THE HOME:  Any stairs in or around the home? Yes  If so, are there any without handrails? No  Home free of loose throw rugs in walkways, pet beds, electrical cords, etc? Yes  Adequate lighting in your home to reduce risk of falls? Yes   ASSISTIVE DEVICES UTILIZED TO PREVENT FALLS:  Life alert? No  Use of a cane, walker or  w/c? No  Grab bars in the bathroom? No  Shower chair or bench in shower? No  Elevated toilet seat or a handicapped toilet? No   TIMED UP AND GO:  Was the test performed? No .      Cognitive Function:        08/23/2022    2:54 PM 07/27/2021    2:56 PM 07/15/2020    8:52 AM 07/15/2019    8:54 AM 10/16/2018   11:15 AM  6CIT Screen  What Year? 0 points 0 points 0 points 0 points 0 points  What month? 0 points 0 points 0 points 0 points 0 points  What time? 0 points 0 points 0 points 0 points 0 points  Count back from 20 0 points 0 points 0 points 0 points 0 points  Months in reverse 0 points 0 points 0 points 0 points 0 points  Repeat phrase 0 points 8 points 0 points 0 points 0 points  Total Score 0 points 8 points 0 points 0 points 0 points    Immunizations  There is no immunization history on file for this patient.  TDAP status: Up to date  Flu Vaccine status: Declined, Education has been provided regarding the importance of this vaccine but patient still declined. Advised may receive this vaccine at local pharmacy or Health Dept. Aware to provide a copy of the vaccination record if obtained from local pharmacy or Health Dept. Verbalized acceptance and understanding.  Pneumococcal vaccine status: Declined,  Education has been provided regarding the importance of this vaccine but patient still declined. Advised may receive this vaccine at local pharmacy or Health Dept. Aware to provide a copy of the vaccination record if obtained from local pharmacy or Health Dept. Verbalized acceptance and understanding.   Covid-19 vaccine status: Declined, Education has been provided regarding the importance of this vaccine but patient still declined. Advised  may receive this vaccine at local pharmacy or Health Dept.or vaccine clinic. Aware to provide a copy of the vaccination record if obtained from local pharmacy or Health Dept. Verbalized acceptance and understanding.  Qualifies for Shingles  Vaccine? Yes   Zostavax completed No   Shingrix Completed?: No.    Education has been provided regarding the importance of this vaccine. Patient has been advised to call insurance company to determine out of pocket expense if they have not yet received this vaccine. Advised may also receive vaccine at local pharmacy or Health Dept. Verbalized acceptance and understanding.  Screening Tests Health Maintenance  Topic Date Due   COVID-19 Vaccine (1) 09/08/2022 (Originally 04/19/1946)   Zoster Vaccines- Shingrix (1 of 2) 11/22/2022 (Originally 04/18/1960)   INFLUENZA VACCINE  03/18/2023 (Originally 07/18/2022)   Pneumonia Vaccine 1+ Years old (1 - PCV) 08/24/2023 (Originally 04/19/2007)   TETANUS/TDAP  09/02/2023   DEXA SCAN  Completed   HPV VACCINES  Aged Out    Health Maintenance  There are no preventive care reminders to display for this patient.   Colorectal cancer screening: No longer required.   Mammogram status: No longer required due to age.  Bone Density status: Completed 10/09/2018.   Lung Cancer Screening: (Low Dose CT Chest recommended if Age 92-80 years, 30 pack-year currently smoking OR have quit w/in 15years.) does not qualify.   Lung Cancer Screening Referral: no  Additional Screening:  Hepatitis C Screening: does not qualify;   Vision Screening: Recommended annual ophthalmology exams for early detection of glaucoma and other disorders of the eye. Is the patient up to date with their annual eye exam?  No  Who is the provider or what is the name of the office in which the patient attends annual eye exams? Dr. Hubbard Robinson If pt is not established with a provider, would they like to be referred to a provider to establish care? No .   Dental Screening: Recommended annual dental exams for proper oral hygiene  Community Resource Referral / Chronic Care Management: CRR required this visit?  No   CCM required this visit?  No      Plan:     I have personally reviewed and  noted the following in the patient's chart:   Medical and social history Use of alcohol, tobacco or illicit drugs  Current medications and supplements including opioid prescriptions. Patient is not currently taking opioid prescriptions. Functional ability and status Nutritional status Physical activity Advanced directives List of other physicians Hospitalizations, surgeries, and ER visits in previous 12 months Vitals Screenings to include cognitive, depression, and falls Referrals and appointments  In addition, I have reviewed and discussed with patient certain preventive protocols, quality metrics, and best practice recommendations. A written personalized care plan for preventive services as well as general preventive health recommendations were provided to patient.     Barb Merino, LPN   01/19/9797   Nurse Notes: none  Due to this being a virtual visit, the after visit summary with patients personalized plan was offered to patient via mail or my-chart. to pick up at office at next visit

## 2022-08-23 NOTE — Patient Instructions (Signed)
Sabrina Rivera , Thank you for taking time to come for your Medicare Wellness Visit. I appreciate your ongoing commitment to your health goals. Please review the following plan we discussed and let me know if I can assist you in the future.   Screening recommendations/referrals: Colonoscopy: not required Mammogram: not required Bone Density: completed 10/09/2018 Recommended yearly ophthalmology/optometry visit for glaucoma screening and checkup Recommended yearly dental visit for hygiene and checkup  Vaccinations: Influenza vaccine: decline Pneumococcal vaccine: decline Tdap vaccine: completed 09/01/2013, due 09/02/2023 Shingles vaccine: discussed   Covid-19: decline  Advanced directives: Advance directive discussed with you today.   Conditions/risks identified: none  Next appointment: Follow up in one year for your annual wellness visit    Preventive Care 65 Years and Older, Female Preventive care refers to lifestyle choices and visits with your health care provider that can promote health and wellness. What does preventive care include? A yearly physical exam. This is also called an annual well check. Dental exams once or twice a year. Routine eye exams. Ask your health care provider how often you should have your eyes checked. Personal lifestyle choices, including: Daily care of your teeth and gums. Regular physical activity. Eating a healthy diet. Avoiding tobacco and drug use. Limiting alcohol use. Practicing safe sex. Taking low-dose aspirin every day. Taking vitamin and mineral supplements as recommended by your health care provider. What happens during an annual well check? The services and screenings done by your health care provider during your annual well check will depend on your age, overall health, lifestyle risk factors, and family history of disease. Counseling  Your health care provider may ask you questions about your: Alcohol use. Tobacco use. Drug  use. Emotional well-being. Home and relationship well-being. Sexual activity. Eating habits. History of falls. Memory and ability to understand (cognition). Work and work Astronomer. Reproductive health. Screening  You may have the following tests or measurements: Height, weight, and BMI. Blood pressure. Lipid and cholesterol levels. These may be checked every 5 years, or more frequently if you are over 60 years old. Skin check. Lung cancer screening. You may have this screening every year starting at age 38 if you have a 30-pack-year history of smoking and currently smoke or have quit within the past 15 years. Fecal occult blood test (FOBT) of the stool. You may have this test every year starting at age 31. Flexible sigmoidoscopy or colonoscopy. You may have a sigmoidoscopy every 5 years or a colonoscopy every 10 years starting at age 26. Hepatitis C blood test. Hepatitis B blood test. Sexually transmitted disease (STD) testing. Diabetes screening. This is done by checking your blood sugar (glucose) after you have not eaten for a while (fasting). You may have this done every 1-3 years. Bone density scan. This is done to screen for osteoporosis. You may have this done starting at age 17. Mammogram. This may be done every 1-2 years. Talk to your health care provider about how often you should have regular mammograms. Talk with your health care provider about your test results, treatment options, and if necessary, the need for more tests. Vaccines  Your health care provider may recommend certain vaccines, such as: Influenza vaccine. This is recommended every year. Tetanus, diphtheria, and acellular pertussis (Tdap, Td) vaccine. You may need a Td booster every 10 years. Zoster vaccine. You may need this after age 78. Pneumococcal 13-valent conjugate (PCV13) vaccine. One dose is recommended after age 59. Pneumococcal polysaccharide (PPSV23) vaccine. One dose is recommended after age  65. Talk to your health care provider about which screenings and vaccines you need and how often you need them. This information is not intended to replace advice given to you by your health care provider. Make sure you discuss any questions you have with your health care provider. Document Released: 12/31/2015 Document Revised: 08/23/2016 Document Reviewed: 10/05/2015 Elsevier Interactive Patient Education  2017 [place] Prevention in the Home Falls can cause injuries. They can happen to people of all ages. There are many things you can do to make your home safe and to help prevent falls. What can I do on the outside of my home? Regularly fix the edges of walkways and driveways and fix any cracks. Remove anything that might make you trip as you walk through a door, such as a raised step or threshold. Trim any bushes or trees on the path to your home. Use bright outdoor lighting. Clear any walking paths of anything that might make someone trip, such as rocks or tools. Regularly check to see if handrails are loose or broken. Make sure that both sides of any steps have handrails. Any raised decks and porches should have guardrails on the edges. Have any leaves, snow, or ice cleared regularly. Use sand or salt on walking paths during winter. Clean up any spills in your garage right away. This includes oil or grease spills. What can I do in the bathroom? Use night lights. Install grab bars by the toilet and in the tub and shower. Do not use towel bars as grab bars. Use non-skid mats or decals in the tub or shower. If you need to sit down in the shower, use a plastic, non-slip stool. Keep the floor dry. Clean up any water that spills on the floor as soon as it happens. Remove soap buildup in the tub or shower regularly. Attach bath mats securely with double-sided non-slip rug tape. Do not have throw rugs and other things on the floor that can make you trip. What can I do in the  bedroom? Use night lights. Make sure that you have a light by your bed that is easy to reach. Do not use any sheets or blankets that are too big for your bed. They should not hang down onto the floor. Have a firm chair that has side arms. You can use this for support while you get dressed. Do not have throw rugs and other things on the floor that can make you trip. What can I do in the kitchen? Clean up any spills right away. Avoid walking on wet floors. Keep items that you use a lot in easy-to-reach places. If you need to reach something above you, use a strong step stool that has a grab bar. Keep electrical cords out of the way. Do not use floor polish or wax that makes floors slippery. If you must use wax, use non-skid floor wax. Do not have throw rugs and other things on the floor that can make you trip. What can I do with my stairs? Do not leave any items on the stairs. Make sure that there are handrails on both sides of the stairs and use them. Fix handrails that are broken or loose. Make sure that handrails are as long as the stairways. Check any carpeting to make sure that it is firmly attached to the stairs. Fix any carpet that is loose or worn. Avoid having throw rugs at the top or bottom of the stairs. If you do have throw  rugs, attach them to the floor with carpet tape. Make sure that you have a light switch at the top of the stairs and the bottom of the stairs. If you do not have them, ask someone to add them for you. What else can I do to help prevent falls? Wear shoes that: Do not have high heels. Have rubber bottoms. Are comfortable and fit you well. Are closed at the toe. Do not wear sandals. If you use a stepladder: Make sure that it is fully opened. Do not climb a closed stepladder. Make sure that both sides of the stepladder are locked into place. Ask someone to hold it for you, if possible. Clearly mark and make sure that you can see: Any grab bars or  handrails. First and last steps. Where the edge of each step is. Use tools that help you move around (mobility aids) if they are needed. These include: Canes. Walkers. Scooters. Crutches. Turn on the lights when you go into a dark area. Replace any light bulbs as soon as they burn out. Set up your furniture so you have a clear path. Avoid moving your furniture around. If any of your floors are uneven, fix them. If there are any pets around you, be aware of where they are. Review your medicines with your doctor. Some medicines can make you feel dizzy. This can increase your chance of falling. Ask your doctor what other things that you can do to help prevent falls. This information is not intended to replace advice given to you by your health care provider. Make sure you discuss any questions you have with your health care provider. Document Released: 09/30/2009 Document Revised: 05/11/2016 Document Reviewed: 01/08/2015 Elsevier Interactive Patient Education  2017 [employer].

## 2022-10-11 ENCOUNTER — Encounter: Payer: Medicare PPO | Admitting: Nurse Practitioner

## 2023-01-29 DIAGNOSIS — J069 Acute upper respiratory infection, unspecified: Secondary | ICD-10-CM | POA: Diagnosis not present

## 2023-03-13 NOTE — Progress Notes (Signed)
SDOH present for Housing.  Will follow up with resources to address.

## 2023-03-15 ENCOUNTER — Encounter: Payer: Self-pay | Admitting: *Deleted

## 2023-03-15 NOTE — Progress Notes (Signed)
Pt seen at March 13, 2023 screening event with screening results wnl. She confirms her PCP is at [facility], where she had her AWV last year and is scheduled for 2024 visit with her PCP Minette Brine FNP in 9/24. Pt identified housing SDOH insecurity and was given resources while at the event. No additional health equity team support indicated at this time.

## 2023-03-20 DIAGNOSIS — H2513 Age-related nuclear cataract, bilateral: Secondary | ICD-10-CM | POA: Diagnosis not present

## 2023-03-20 DIAGNOSIS — H04123 Dry eye syndrome of bilateral lacrimal glands: Secondary | ICD-10-CM | POA: Diagnosis not present

## 2023-03-20 DIAGNOSIS — H524 Presbyopia: Secondary | ICD-10-CM | POA: Diagnosis not present

## 2023-03-20 DIAGNOSIS — H52223 Regular astigmatism, bilateral: Secondary | ICD-10-CM | POA: Diagnosis not present

## 2023-03-20 DIAGNOSIS — H5203 Hypermetropia, bilateral: Secondary | ICD-10-CM | POA: Diagnosis not present

## 2023-06-20 DIAGNOSIS — Z885 Allergy status to narcotic agent status: Secondary | ICD-10-CM | POA: Diagnosis not present

## 2023-06-20 DIAGNOSIS — G3184 Mild cognitive impairment, so stated: Secondary | ICD-10-CM | POA: Diagnosis not present

## 2023-06-20 DIAGNOSIS — Z8249 Family history of ischemic heart disease and other diseases of the circulatory system: Secondary | ICD-10-CM | POA: Diagnosis not present

## 2023-06-20 DIAGNOSIS — R600 Localized edema: Secondary | ICD-10-CM | POA: Diagnosis not present

## 2023-06-20 DIAGNOSIS — R32 Unspecified urinary incontinence: Secondary | ICD-10-CM | POA: Diagnosis not present

## 2023-06-20 DIAGNOSIS — I10 Essential (primary) hypertension: Secondary | ICD-10-CM | POA: Diagnosis not present

## 2023-06-20 DIAGNOSIS — R011 Cardiac murmur, unspecified: Secondary | ICD-10-CM | POA: Diagnosis not present

## 2023-08-03 ENCOUNTER — Encounter: Payer: Self-pay | Admitting: Family Medicine

## 2023-08-03 ENCOUNTER — Ambulatory Visit: Payer: Medicare PPO | Admitting: Family Medicine

## 2023-08-03 VITALS — BP 136/84 | HR 70 | Temp 98.5°F | Ht 63.0 in | Wt 131.2 lb

## 2023-08-03 DIAGNOSIS — E782 Mixed hyperlipidemia: Secondary | ICD-10-CM

## 2023-08-03 DIAGNOSIS — Z0000 Encounter for general adult medical examination without abnormal findings: Secondary | ICD-10-CM | POA: Diagnosis not present

## 2023-08-03 DIAGNOSIS — R011 Cardiac murmur, unspecified: Secondary | ICD-10-CM | POA: Diagnosis not present

## 2023-08-03 DIAGNOSIS — R7303 Prediabetes: Secondary | ICD-10-CM

## 2023-08-03 DIAGNOSIS — R8290 Unspecified abnormal findings in urine: Secondary | ICD-10-CM | POA: Diagnosis not present

## 2023-08-03 DIAGNOSIS — L659 Nonscarring hair loss, unspecified: Secondary | ICD-10-CM | POA: Diagnosis not present

## 2023-08-03 DIAGNOSIS — B351 Tinea unguium: Secondary | ICD-10-CM

## 2023-08-03 LAB — LIPID PANEL
Cholesterol: 261 mg/dL — ABNORMAL HIGH (ref 0–200)
HDL: 85.5 mg/dL (ref >39.00)
LDL Cholesterol: 157 mg/dL — ABNORMAL HIGH (ref 0–99)
NonHDL: 175.41
Total CHOL/HDL Ratio: 3
Triglycerides: 92 mg/dL (ref 0.0–149.0)
VLDL: 18.4 mg/dL (ref 0.0–40.0)

## 2023-08-03 LAB — POC URINALSYSI DIPSTICK (AUTOMATED)
Bilirubin, UA: NEGATIVE
Blood, UA: NEGATIVE
Glucose, UA: NEGATIVE
Ketones, UA: NEGATIVE
Nitrite, UA: NEGATIVE
Protein, UA: NEGATIVE
Spec Grav, UA: 1.015 (ref 1.010–1.025)
Urobilinogen, UA: NEGATIVE E.U./dL — AB
pH, UA: 6 (ref 5.0–8.0)

## 2023-08-03 LAB — COMPREHENSIVE METABOLIC PANEL
ALT: 13 U/L (ref 0–35)
AST: 24 U/L (ref 0–37)
Albumin: 4.3 g/dL (ref 3.5–5.2)
Alkaline Phosphatase: 61 U/L (ref 39–117)
BUN: 13 mg/dL (ref 6–23)
CO2: 31 meq/L (ref 19–32)
Calcium: 9.5 mg/dL (ref 8.4–10.5)
Chloride: 101 meq/L (ref 96–112)
Creatinine, Ser: 0.87 mg/dL (ref 0.40–1.20)
GFR: 62.54 mL/min (ref >60.00)
Glucose, Bld: 96 mg/dL (ref 70–99)
Potassium: 4 meq/L (ref 3.5–5.1)
Sodium: 138 meq/L (ref 135–145)
Total Bilirubin: 0.5 mg/dL (ref 0.2–1.2)
Total Protein: 7.2 g/dL (ref 6.0–8.3)

## 2023-08-03 LAB — CBC WITH DIFFERENTIAL/PLATELET
Basophils Absolute: 0.1 10*3/uL (ref 0.0–0.1)
Basophils Relative: 1.3 % (ref 0.0–3.0)
Eosinophils Absolute: 0.1 10*3/uL (ref 0.0–0.7)
Eosinophils Relative: 1.5 % (ref 0.0–5.0)
HCT: 37.4 % (ref 36.0–46.0)
Hemoglobin: 11.7 g/dL — ABNORMAL LOW (ref 12.0–15.0)
Lymphocytes Relative: 35 % (ref 12.0–46.0)
Lymphs Abs: 1.5 10*3/uL (ref 0.7–4.0)
MCHC: 31.4 g/dL (ref 30.0–36.0)
MCV: 87.3 fl (ref 78.0–100.0)
Monocytes Absolute: 0.5 10*3/uL (ref 0.1–1.0)
Monocytes Relative: 12.6 % — ABNORMAL HIGH (ref 3.0–12.0)
Neutro Abs: 2.1 10*3/uL (ref 1.4–7.7)
Neutrophils Relative %: 49.6 % (ref 43.0–77.0)
Platelets: 318 10*3/uL (ref 150.0–400.0)
RBC: 4.28 Mil/uL (ref 3.87–5.11)
RDW: 13.6 % (ref 11.5–15.5)
WBC: 4.2 10*3/uL (ref 4.0–10.5)

## 2023-08-03 LAB — TSH: TSH: 0.77 u[IU]/mL (ref 0.35–5.50)

## 2023-08-03 LAB — T4, FREE: Free T4: 0.71 ng/dL (ref 0.60–1.60)

## 2023-08-03 NOTE — Patient Instructions (Signed)
It was nice meeting you today.  We will have you schedule follow-up in the next few months, sooner as needed.  We will contact you when we get the lab results in the next few days.  A urine culture was ordered to further evaluate your urine sample.  There were some white blood cells noted but no overt signs of infection.    Think about having the echocardiogram (echo) done to further evaluate the heart murmur.  If this is something you think you would like to have done call the clinic so we can place the order for you.

## 2023-08-03 NOTE — Progress Notes (Signed)
Established Patient Office Visit   Subjective  Patient ID: [name], female    DOB: 1941-12-08  Age: 82 y.o. MRN: [id number]  Chief Complaint  Patient presents with   Establish Care    Hair loss noticed it about 6 months ago.     Patient is an 82 year old female previously seen at [facility] who presents for establish care.  Patient states she thought she was having a CPE this visit as she is fasting.  Requesting labs and urine to be checked.  Patient states her original doctor left the practice and she was being seen by nurse practitioner.  Patient states she is overall healthy.  Taking vitamins and supplements but no prescription medications.  HLD: Patient states she was told her cholesterol was elevated but she declined medication.  Taking red yeast rice.  Pre-DM: States hemoglobin A1c was 6.7%.  Patient notes eating several servings of fruit daily.  Toenail discoloration noted after having a pedicure.  Hair concern: Patient with thinning of hair.  Murmur:  heard on exam. Pt mentions h/o of murmurs in family.  Has not had it worked up.  Denies LE edema, SOB.  Allergies: NKDA  Past surgical history: History of right lower leg/ankle fracture requiring pins.  Occurred after falling while rollerskating Tonsillectomy-1950  Social history: Patient is retired.  She denies alcohol, tobacco, drug use.    Past Medical History:  Diagnosis Date   Hyperlipidemia    Internal hemorrhoids    Statin declined 09/01/2020   Type 2 diabetes mellitus (HCC)    Vitamin D deficiency    Past Surgical History:  Procedure Laterality Date   DG RIGHT TIBIA AND FIBULA ([facility] HX)  1995   broke leg roller skating   TONSILLECTOMY Bilateral in second grade   Social History   Tobacco Use   Smoking status: Never   Smokeless tobacco: Never  Vaping Use   Vaping status: Never Used  Substance Use Topics   Alcohol use: Never    Alcohol/week: 0.0 standard drinks of alcohol   Drug  use: Never   Family History  Problem Relation Age of Onset   Heart disease Brother    Hypertension Father    Lung cancer Father    Stroke Mother    Hypertension Mother    Thyroid disease Mother    Allergies  Allergen Reactions   Morphine And Codeine Nausea And Vomiting      ROS Negative unless stated above    Objective:     BP 136/84 (BP Location: Right Arm, Patient Position: Sitting, Cuff Size: Normal)   Pulse 70   Temp 98.5 F (36.9 C) (Oral)   Ht 5\' 3"  (1.6 m)   Wt 131 lb 3.2 oz (59.5 kg)   SpO2 98%   BMI 23.24 kg/m    Physical Exam Constitutional:      Appearance: Normal appearance.  HENT:     Head: Normocephalic and atraumatic.     Right Ear: Tympanic membrane, ear canal and external ear normal.     Left Ear: Tympanic membrane, ear canal and external ear normal.     Nose: Nose normal.     Mouth/Throat:     Mouth: Mucous membranes are moist.     Pharynx: No oropharyngeal exudate or posterior oropharyngeal erythema.  Eyes:     General: No scleral icterus.    Extraocular Movements: Extraocular movements intact.     Conjunctiva/sclera: Conjunctivae normal.     Pupils: Pupils are equal,  round, and reactive to light.  Neck:     Thyroid: No thyromegaly.  Cardiovascular:     Rate and Rhythm: Normal rate and regular rhythm.     Pulses: Normal pulses.     Heart sounds: Murmur heard.     No friction rub.     Comments: 3/6 murmur hear throughout. Pulmonary:     Effort: Pulmonary effort is normal.     Breath sounds: Normal breath sounds. No wheezing, rhonchi or rales.  Abdominal:     General: Bowel sounds are normal.     Palpations: Abdomen is soft.     Tenderness: There is no abdominal tenderness.  Musculoskeletal:        General: No deformity. Normal range of motion.     Right lower leg: No edema.     Left lower leg: No edema.  Lymphadenopathy:     Cervical: No cervical adenopathy.  Skin:    General: Skin is warm and dry.     Findings: No lesion.      Comments: Yellow/whitish discoloration of right great toenail.  Central thinning/loss of hair  Neurological:     General: No focal deficit present.     Mental Status: She is alert and oriented to person, place, and time.  Psychiatric:        Mood and Affect: Mood normal.        Thought Content: Thought content normal.       08/03/2023   11:03 AM 08/23/2022    2:52 PM 07/27/2021    2:55 PM  Depression screen PHQ 2/9  Decreased Interest 0 0 0  Down, Depressed, Hopeless 0 0 0  PHQ - 2 Score 0 0 0  Altered sleeping 0    Tired, decreased energy 0    Change in appetite 0    Feeling bad or failure about yourself  0    Trouble concentrating 0    Moving slowly or fidgety/restless 0    Suicidal thoughts 0    PHQ-9 Score 0        08/03/2023   11:03 AM  GAD 7 : Generalized Anxiety Score  Nervous, Anxious, on Edge 0  Control/stop worrying 0  Worry too much - different things 1  Trouble relaxing 0  Restless 0  Easily annoyed or irritable 0  Afraid - awful might happen 0  Total GAD 7 Score 1  Anxiety Difficulty Not difficult at all      No results found for any visits on 08/03/23.    Assessment & Plan:  Encounter for health maintenance examination in adult -Age-appropriate health screenings discussed -Labs obtained -Next CPE in 1 year. -     POCT Urinalysis Dipstick (Automated) -     Urine Culture  Mixed hyperlipidemia -History of elevated cholesterol.  Not currently on statin or other prescription medication. -Lifestyle modifications encouraged -     Comprehensive metabolic panel -     Lipid panel  Hair thinning -     TSH -     T4, free -     CBC with Differential/Platelet  Prediabetes -     Hemoglobin A1c -     POCT Urinalysis Dipstick (Automated) -     Urine Culture  Onychomycosis -OTC antifungal.  Advised on likely duration of use.  Murmur -3/6 murmur heard on exam. -Asymptomatic. -Discussed obtaining echo.  Patient wishes to wait at this  time.  Abnormal finding in urine -UA with 3+ leuks -Will obtain UCX. -As patient  currently asymptomatic will wait for culture result to provide prescription if needed  Return in about 4 months (around 12/03/2023), or if symptoms worsen or fail to improve.   Deeann Saint, MD

## 2023-08-04 LAB — URINE CULTURE
MICRO NUMBER:: 15342103
Result:: NO GROWTH
SPECIMEN QUALITY:: ADEQUATE

## 2023-08-06 LAB — HEMOGLOBIN A1C: Hgb A1c MFr Bld: 6.3 % (ref 4.6–6.5)

## 2023-08-30 ENCOUNTER — Ambulatory Visit: Payer: Self-pay | Admitting: Nurse Practitioner

## 2023-09-05 ENCOUNTER — Ambulatory Visit: Payer: Self-pay | Admitting: Nurse Practitioner

## 2023-10-03 ENCOUNTER — Ambulatory Visit (INDEPENDENT_AMBULATORY_CARE_PROVIDER_SITE_OTHER): Payer: Medicare PPO

## 2023-10-03 VITALS — Ht 63.0 in | Wt 131.0 lb

## 2023-10-03 DIAGNOSIS — Z0000 Encounter for general adult medical examination without abnormal findings: Secondary | ICD-10-CM | POA: Diagnosis not present

## 2023-10-03 NOTE — Progress Notes (Signed)
Subjective:   Sabrina Rivera is a 82 y.o. female who presents for Medicare Annual (Subsequent) preventive examination.  Visit Complete: Virtual I connected with  KARISMA MEISER on 10/03/23 by a audio enabled telemedicine application and verified that I am speaking with the correct person using two identifiers.  Patient Location: Home  Provider Location: Home Office  I discussed the limitations of evaluation and management by telemedicine. The patient expressed understanding and agreed to proceed.  Vital Signs: Because this visit was a virtual/telehealth visit, some criteria may be missing or patient reported. Any vitals not documented were not able to be obtained and vitals that have been documented are patient reported.    Cardiac Risk Factors include: advanced age (>8men, >34 women)     Objective:    Today's Vitals   10/03/23 1138  Weight: 131 lb (59.4 kg)  Height: 5\' 3"  (1.6 m)   Body mass index is 23.21 kg/m.     10/03/2023   11:46 AM 08/23/2022    2:51 PM 07/27/2021    2:54 PM 07/15/2020    8:49 AM 07/15/2019    8:45 AM 10/16/2018   11:10 AM  Advanced Directives  Does Patient Have a Medical Advance Directive? No No No No No No  Would patient like information on creating a medical advance directive? No - Patient declined  No - Patient declined No - Patient declined  No - Patient declined    Current Medications (verified) Outpatient Encounter Medications as of 10/03/2023  Medication Sig   Ascorbic Acid (VITAMIN C) 100 MG tablet Take 100 mg by mouth daily.   Cholecalciferol (VITAMIN D3) 1000 units CAPS Take 1 capsule by mouth daily.   co-enzyme Q-10 30 MG capsule Take 30 mg by mouth daily.   Garlic 100 MG TABS Take 1 tablet by mouth.   glucosamine-chondroitin 500-400 MG tablet Take 1 tablet by mouth daily.   lactobacillus acidophilus (BACID) TABS tablet Take 1 tablet by mouth 3 (three) times daily.   Multiple Vitamin (MULTIVITAMIN WITH MINERALS) TABS tablet Take 1  tablet by mouth daily.   Naphazoline-Pheniramine (EYE ALLERGY RELIEF OP) Apply to eye.   Red Yeast Rice 600 MG CAPS Take 1 capsule by mouth daily.   Terbinafine HCl (LAMISIL AT SPRAY) 1 % SOLN Apply 2 sprays topically daily. (Patient not taking: Reported on 08/03/2023)   Zinc Sulfate (ZINC 15 PO) Take by mouth.   No facility-administered encounter medications on file as of 10/03/2023.    Allergies (verified) Morphine and codeine   History: Past Medical History:  Diagnosis Date   Hyperlipidemia    Internal hemorrhoids    Statin declined 09/01/2020   Type 2 diabetes mellitus (HCC)    Vitamin D deficiency    Past Surgical History:  Procedure Laterality Date   DG RIGHT TIBIA AND FIBULA ([facility] HX)  1995   broke leg roller skating   TONSILLECTOMY Bilateral in second grade   Family History  Problem Relation Age of Onset   Heart disease Brother    Hypertension Father    Lung cancer Father    Stroke Mother    Hypertension Mother    Thyroid disease Mother    Social History   Socioeconomic History   Marital status: Divorced    Spouse name: Not on file   Number of children: Not on file   Years of education: Not on file   Highest education level: Not on file  Occupational History   Occupation: retired  Tobacco  Use   Smoking status: Never   Smokeless tobacco: Never  Vaping Use   Vaping status: Never Used  Substance and Sexual Activity   Alcohol use: Never    Alcohol/week: 0.0 standard drinks of alcohol   Drug use: Never   Sexual activity: Not Currently  Other Topics Concern   Not on file  Social History Narrative   Not on file   Social Determinants of Health   Financial Resource Strain: Low Risk  (10/03/2023)   Overall Financial Resource Strain (CARDIA)    Difficulty of Paying Living Expenses: Not hard at all  Food Insecurity: No Food Insecurity (10/03/2023)   Hunger Vital Sign    Worried About Running Out of Food in the Last Year: Never true    Ran Out of Food in  the Last Year: Never true  Transportation Needs: No Transportation Needs (10/03/2023)   PRAPARE - Administrator, Civil Service (Medical): No    Lack of Transportation (Non-Medical): No  Physical Activity: Insufficiently Active (10/03/2023)   Exercise Vital Sign    Days of Exercise per Week: 3 days    Minutes of Exercise per Session: 30 min  Stress: No Stress Concern Present (10/03/2023)   [employer] of Occupational Health - Occupational Stress Questionnaire    Feeling of Stress : Not at all  Social Connections: Moderately Integrated (10/03/2023)   Social Connection and Isolation Panel [NHANES]    Frequency of Communication with Friends and Family: More than three times a week    Frequency of Social Gatherings with Friends and Family: More than three times a week    Attends Religious Services: More than 4 times per year    Active Member of [employer] or Organizations: Yes    Attends Engineer, structural: More than 4 times per year    Marital Status: Divorced    Tobacco Counseling Counseling given: Not Answered   Clinical Intake:  Pre-visit preparation completed: Yes  Pain : No/denies pain     BMI - recorded: 23.21 Nutritional Status: BMI of 19-24  Normal Nutritional Risks: None Diabetes: No  How often do you need to have someone help you when you read instructions, pamphlets, or other written materials from your doctor or pharmacy?: 1 - Never  Interpreter Needed?: No  Information entered by :: Theresa Mulligan LPN   Activities of Daily Living    10/03/2023   11:44 AM  In your present state of health, do you have any difficulty performing the following activities:  Hearing? 0  Vision? 0  Difficulty concentrating or making decisions? 0  Walking or climbing stairs? 0  Dressing or bathing? 0  Doing errands, shopping? 0  Preparing Food and eating ? N  Using the Toilet? N  In the past six months, have you accidently leaked urine? Y  Comment  Wears pads. Followed by PCP  Do you have problems with loss of bowel control? N  Managing your Medications? N  Managing your Finances? N  Housekeeping or managing your Housekeeping? N    Patient Care Team: Deeann Saint, MD as PCP - General (Family Medicine)  Indicate any recent Medical Services you may have received from other than [facility] providers in the past year (date may be approximate).     Assessment:   This is a routine wellness examination for M[place]o.  Hearing/Vision screen Hearing Screening - Comments:: Denies hearing difficulties   Vision Screening - Comments:: Wears rx glasses - up to date with routine  eye exams with  [facility]   Goals Addressed               This Visit's Progress     Complete personal projects (pt-stated)         Depression Screen    10/03/2023   11:58 AM 08/03/2023   11:03 AM 08/23/2022    2:52 PM 07/27/2021    2:55 PM 07/27/2021    2:15 PM 07/15/2020    8:50 AM 09/09/2019   10:05 AM  PHQ 2/9 Scores  PHQ - 2 Score 0 0 0 0 0 0 0  PHQ- 9 Score  0   0 0     Fall Risk    10/03/2023   11:45 AM 08/03/2023   10:53 AM 08/23/2022    2:52 PM 07/27/2021    2:55 PM 07/27/2021    2:14 PM  Fall Risk   Falls in the past year? 0 0 0 0 0  Number falls in past yr: 0 0 0  0  Injury with Fall? 0 0 0  0  Risk for fall due to : No Fall Risks No Fall Risks No Fall Risks Medication side effect   Follow up Falls prevention discussed Falls evaluation completed Falls prevention discussed;Education provided;Falls evaluation completed Falls evaluation completed;Education provided;Falls prevention discussed     MEDICARE RISK AT HOME: Medicare Risk at Home Any stairs in or around the home?: Yes If so, are there any without handrails?: No Home free of loose throw rugs in walkways, pet beds, electrical cords, etc?: Yes Adequate lighting in your home to reduce risk of falls?: Yes Life alert?: No Use of a cane, walker or w/c?: No Grab bars in the bathroom?:  No Shower chair or bench in shower?: No Elevated toilet seat or a handicapped toilet?: No  TIMED UP AND GO:  Was the test performed?  No    Cognitive Function:        10/03/2023   11:46 AM 08/23/2022    2:54 PM 07/27/2021    2:56 PM 07/15/2020    8:52 AM 07/15/2019    8:54 AM  6CIT Screen  What Year? 0 points 0 points 0 points 0 points 0 points  What month? 0 points 0 points 0 points 0 points 0 points  What time? 0 points 0 points 0 points 0 points 0 points  Count back from 20 0 points 0 points 0 points 0 points 0 points  Months in reverse 0 points 0 points 0 points 0 points 0 points  Repeat phrase 0 points 0 points 8 points 0 points 0 points  Total Score 0 points 0 points 8 points 0 points 0 points    Immunizations  There is no immunization history on file for this patient.  TDAP status: Due, Education has been provided regarding the importance of this vaccine. Advised may receive this vaccine at local pharmacy or Health Dept. Aware to provide a copy of the vaccination record if obtained from local pharmacy or Health Dept. Verbalized acceptance and understanding.  Flu Vaccine status: Due, Education has been provided regarding the importance of this vaccine. Advised may receive this vaccine at local pharmacy or Health Dept. Aware to provide a copy of the vaccination record if obtained from local pharmacy or Health Dept. Verbalized acceptance and understanding.  Pneumococcal vaccine status: Due, Education has been provided regarding the importance of this vaccine. Advised may receive this vaccine at local pharmacy or Health Dept. Aware to provide  a copy of the vaccination record if obtained from local pharmacy or Health Dept. Verbalized acceptance and understanding.  Covid-19 vaccine status: Declined, Education has been provided regarding the importance of this vaccine but patient still declined. Advised may receive this vaccine at local pharmacy or Health Dept.or vaccine clinic.  Aware to provide a copy of the vaccination record if obtained from local pharmacy or Health Dept. Verbalized acceptance and understanding.  Qualifies for Shingles Vaccine? Yes   Zostavax completed No   Shingrix Completed?: No.    Education has been provided regarding the importance of this vaccine. Patient has been advised to call insurance company to determine out of pocket expense if they have not yet received this vaccine. Advised may also receive vaccine at local pharmacy or Health Dept. Verbalized acceptance and understanding.  Screening Tests Health Maintenance  Topic Date Due   COVID-19 Vaccine (1) Never done   DTaP/Tdap/Td (1 - Tdap) Never done   Zoster Vaccines- Shingrix (1 of 2) Never done   Pneumonia Vaccine 47+ Years old (1 of 1 - PCV) Never done   INFLUENZA VACCINE  Never done   Medicare Annual Wellness (AWV)  10/02/2024   DEXA SCAN  Completed   HPV VACCINES  Aged Out   Hepatitis C Screening  Discontinued    Health Maintenance  Health Maintenance Due  Topic Date Due   COVID-19 Vaccine (1) Never done   DTaP/Tdap/Td (1 - Tdap) Never done   Zoster Vaccines- Shingrix (1 of 2) Never done   Pneumonia Vaccine 64+ Years old (1 of 1 - PCV) Never done   INFLUENZA VACCINE  Never done        Bone Density status: Completed 10/09/18. Results reflect: Bone density results: NORMAL. Repeat every   years.     Additional Screening:    Vision Screening: Recommended annual ophthalmology exams for early detection of glaucoma and other disorders of the eye. Is the patient up to date with their annual eye exam?  Yes  Who is the provider or what is the name of the office in which the patient attends annual eye exams? [facility] If pt is not established with a provider, would they like to be referred to a provider to establish care? No .   Dental Screening: Recommended annual dental exams for proper oral hygiene    Community Resource Referral / Chronic Care  Management:  CRR required this visit?  No   CCM required this visit?  No     Plan:     I have personally reviewed and noted the following in the patient's chart:   Medical and social history Use of alcohol, tobacco or illicit drugs  Current medications and supplements including opioid prescriptions. Patient is not currently taking opioid prescriptions. Functional ability and status Nutritional status Physical activity Advanced directives List of other physicians Hospitalizations, surgeries, and ER visits in previous 12 months Vitals Screenings to include cognitive, depression, and falls Referrals and appointments  In addition, I have reviewed and discussed with patient certain preventive protocols, quality metrics, and best practice recommendations. A written personalized care plan for preventive services as well as general preventive health recommendations were provided to patient.     Tillie Rung, LPN   [id number]   After Visit Summary: (MyChart) Due to this being a telephonic visit, the after visit summary with patients personalized plan was offered to patient via MyChart   Nurse Notes: None

## 2023-10-03 NOTE — Patient Instructions (Addendum)
[name] , Thank you for taking time to come for your Medicare Wellness Visit. I appreciate your ongoing commitment to your health goals. Please review the following plan we discussed and let me know if I can assist you in the future.   Referrals/Orders/Follow-Ups/Clinician Recommendations:   This is a list of the screening recommended for you and due dates:  Health Maintenance  Topic Date Due   COVID-19 Vaccine (1) Never done   DTaP/Tdap/Td vaccine (1 - Tdap) Never done   Zoster (Shingles) Vaccine (1 of 2) Never done   Pneumonia Vaccine (1 of 1 - PCV) Never done   Flu Shot  Never done   Medicare Annual Wellness Visit  10/02/2024   DEXA scan (bone density measurement)  Completed   HPV Vaccine  Aged Out   Hepatitis C Screening  Discontinued    Advanced directives: (Declined) Advance directive discussed with you today. Even though you declined this today, please call our office should you change your mind, and we can give you the proper paperwork for you to fill out.  Next Medicare Annual Wellness Visit scheduled for next year: Yes

## 2024-08-07 ENCOUNTER — Ambulatory Visit (INDEPENDENT_AMBULATORY_CARE_PROVIDER_SITE_OTHER): Admitting: Family Medicine

## 2024-08-07 ENCOUNTER — Encounter: Payer: Self-pay | Admitting: Family Medicine

## 2024-08-07 VITALS — BP 134/62 | HR 70 | Temp 98.4°F | Ht 63.0 in | Wt 140.4 lb

## 2024-08-07 DIAGNOSIS — R7303 Prediabetes: Secondary | ICD-10-CM | POA: Diagnosis not present

## 2024-08-07 DIAGNOSIS — Z0000 Encounter for general adult medical examination without abnormal findings: Secondary | ICD-10-CM | POA: Diagnosis not present

## 2024-08-07 DIAGNOSIS — R011 Cardiac murmur, unspecified: Secondary | ICD-10-CM | POA: Diagnosis not present

## 2024-08-07 DIAGNOSIS — E782 Mixed hyperlipidemia: Secondary | ICD-10-CM | POA: Diagnosis not present

## 2024-08-07 LAB — POCT URINALYSIS DIPSTICK
Bilirubin, UA: NEGATIVE
Blood, UA: NEGATIVE
Glucose, UA: NEGATIVE
Ketones, UA: NEGATIVE
Nitrite, UA: NEGATIVE
Protein, UA: POSITIVE — AB
Spec Grav, UA: 1.02 (ref 1.010–1.025)
Urobilinogen, UA: 0.2 U/dL
pH, UA: 6 (ref 5.0–8.0)

## 2024-08-07 LAB — COMPREHENSIVE METABOLIC PANEL WITH GFR
ALT: 15 U/L (ref 0–35)
AST: 22 U/L (ref 0–37)
Albumin: 4.2 g/dL (ref 3.5–5.2)
Alkaline Phosphatase: 59 U/L (ref 39–117)
BUN: 16 mg/dL (ref 6–23)
CO2: 31 meq/L (ref 19–32)
Calcium: 9.4 mg/dL (ref 8.4–10.5)
Chloride: 104 meq/L (ref 96–112)
Creatinine, Ser: 0.86 mg/dL (ref 0.40–1.20)
GFR: 62.97 mL/min (ref >60.00)
Glucose, Bld: 94 mg/dL (ref 70–99)
Potassium: 4.6 meq/L (ref 3.5–5.1)
Sodium: 143 meq/L (ref 135–145)
Total Bilirubin: 0.5 mg/dL (ref 0.2–1.2)
Total Protein: 6.9 g/dL (ref 6.0–8.3)

## 2024-08-07 LAB — LIPID PANEL
Cholesterol: 272 mg/dL — ABNORMAL HIGH (ref 0–200)
HDL: 95.1 mg/dL (ref >39.00)
LDL Cholesterol: 164 mg/dL — ABNORMAL HIGH (ref 0–99)
NonHDL: 177.36
Total CHOL/HDL Ratio: 3
Triglycerides: 69 mg/dL (ref 0.0–149.0)
VLDL: 13.8 mg/dL (ref 0.0–40.0)

## 2024-08-07 LAB — CBC WITH DIFFERENTIAL/PLATELET
Basophils Absolute: 0 K/uL (ref 0.0–0.1)
Basophils Relative: 0.9 % (ref 0.0–3.0)
Eosinophils Absolute: 0.2 K/uL (ref 0.0–0.7)
Eosinophils Relative: 4.3 % (ref 0.0–5.0)
HCT: 37 % (ref 36.0–46.0)
Hemoglobin: 11.8 g/dL — ABNORMAL LOW (ref 12.0–15.0)
Lymphocytes Relative: 38.7 % (ref 12.0–46.0)
Lymphs Abs: 1.4 K/uL (ref 0.7–4.0)
MCHC: 31.9 g/dL (ref 30.0–36.0)
MCV: 86 fl (ref 78.0–100.0)
Monocytes Absolute: 0.4 K/uL (ref 0.1–1.0)
Monocytes Relative: 11.8 % (ref 3.0–12.0)
Neutro Abs: 1.6 K/uL (ref 1.4–7.7)
Neutrophils Relative %: 44.3 % (ref 43.0–77.0)
Platelets: 281 K/uL (ref 150.0–400.0)
RBC: 4.3 Mil/uL (ref 3.87–5.11)
RDW: 13.5 % (ref 11.5–15.5)
WBC: 3.6 K/uL — ABNORMAL LOW (ref 4.0–10.5)

## 2024-08-07 LAB — T4, FREE: Free T4: 0.79 ng/dL (ref 0.60–1.60)

## 2024-08-07 LAB — HEMOGLOBIN A1C: Hgb A1c MFr Bld: 6.5 % (ref 4.6–6.5)

## 2024-08-07 LAB — TSH: TSH: 0.81 u[IU]/mL (ref 0.35–5.50)

## 2024-08-07 NOTE — Progress Notes (Signed)
 Established Patient Office Visit   Subjective  Patient ID: Sabrina Rivera, female    DOB: 05-30-1941  Age: 83 y.o. MRN: [id number]  Chief Complaint  Patient presents with   Annual Exam    Pt is an 83 year old female seen for CPE.  Patient states she is well overall.  Inquires about blood work being done this visit and other components of a physical.  Patient states she was advised she no longer had to get mammograms or Paps.  Patient taking a few over-the-counter supplements but no prescription medications.    Patient Active Problem List   Diagnosis Date Noted   Mixed hyperlipidemia 09/01/2020   Statin declined 09/01/2020   Past Medical History:  Diagnosis Date   Hyperlipidemia    Internal hemorrhoids    Statin declined 09/01/2020   Type 2 diabetes mellitus (HCC)    Vitamin D  deficiency    Past Surgical History:  Procedure Laterality Date   DG RIGHT TIBIA AND FIBULA ([facility] HX)  1995   broke leg roller skating   TONSILLECTOMY Bilateral in second grade   Social History   Tobacco Use   Smoking status: Never   Smokeless tobacco: Never  Vaping Use   Vaping status: Never Used  Substance Use Topics   Alcohol use: Never    Alcohol/week: 0.0 standard drinks of alcohol   Drug use: Never   Family History  Problem Relation Age of Onset   Heart disease Brother    Hypertension Father    Lung cancer Father    Stroke Mother    Hypertension Mother    Thyroid  disease Mother    Allergies  Allergen Reactions   Morphine And Codeine Nausea And Vomiting    ROS Negative unless stated above    Objective:     BP 134/62 (BP Location: Left Arm, Patient Position: Sitting, Cuff Size: Normal)   Pulse 70   Temp 98.4 F (36.9 C) (Oral)   Ht 5' 3 (1.6 m)   Wt 140 lb 6.4 oz (63.7 kg)   SpO2 99%   BMI 24.87 kg/m  BP Readings from Last 3 Encounters:  08/07/24 134/62  08/03/23 136/84  03/13/23 129/79   Wt Readings from Last 3 Encounters:  08/07/24 140 lb 6.4 oz (63.7 kg)   10/03/23 131 lb (59.4 kg)  08/03/23 131 lb 3.2 oz (59.5 kg)      Physical Exam Constitutional:      Appearance: Normal appearance.  HENT:     Head: Normocephalic and atraumatic.     Right Ear: Tympanic membrane, ear canal and external ear normal.     Left Ear: Tympanic membrane, ear canal and external ear normal.     Nose: Nose normal.     Mouth/Throat:     Mouth: Mucous membranes are moist.     Pharynx: No oropharyngeal exudate or posterior oropharyngeal erythema.  Eyes:     General: No scleral icterus.    Extraocular Movements: Extraocular movements intact.     Conjunctiva/sclera: Conjunctivae normal.     Pupils: Pupils are equal, round, and reactive to light.  Neck:     Thyroid : No thyromegaly.     Vascular: No carotid bruit.  Cardiovascular:     Rate and Rhythm: Normal rate and regular rhythm.     Pulses: Normal pulses.     Heart sounds: Murmur heard.     Systolic murmur is present with a grade of 3/6.     No friction rub.  Comments: 3/6 murmur heard throughout, best heard LUSB Pulmonary:     Effort: Pulmonary effort is normal.     Breath sounds: Normal breath sounds. No wheezing, rhonchi or rales.  Abdominal:     General: Bowel sounds are normal.     Palpations: Abdomen is soft.     Tenderness: There is no abdominal tenderness.  Musculoskeletal:        General: No deformity. Normal range of motion.  Lymphadenopathy:     Cervical: No cervical adenopathy.  Skin:    General: Skin is warm and dry.     Findings: No lesion.  Neurological:     General: No focal deficit present.     Mental Status: She is alert and oriented to person, place, and time.  Psychiatric:        Mood and Affect: Mood normal.        Thought Content: Thought content normal.        08/07/2024    1:48 PM 10/03/2023   11:58 AM 08/03/2023   11:03 AM  Depression screen PHQ 2/9  Decreased Interest 0 0 0  Down, Depressed, Hopeless 0 0 0  PHQ - 2 Score 0 0 0  Altered sleeping 0  0   Tired, decreased energy 0  0  Change in appetite 0  0  Feeling bad or failure about yourself  0  0  Trouble concentrating 0  0  Moving slowly or fidgety/restless 0  0  Suicidal thoughts 0  0  PHQ-9 Score 0  0      08/07/2024    1:48 PM 08/03/2023   11:03 AM  GAD 7 : Generalized Anxiety Score  Nervous, Anxious, on Edge 0 0  Control/stop worrying 0 0  Worry too much - different things 0 1  Trouble relaxing 0 0  Restless 0 0  Easily annoyed or irritable 0 0  Afraid - awful might happen 0 0  Total GAD 7 Score 0 1  Anxiety Difficulty  Not difficult at all      Assessment & Plan:   Well adult exam -     CBC with Differential/Platelet; Future -     Comprehensive metabolic panel with GFR; Future -     Hemoglobin A1c; Future -     Lipid panel; Future -     T4, free; Future -     TSH; Future -     POCT urinalysis dipstick  Mixed hyperlipidemia -     Lipid panel; Future  Prediabetes -     Hemoglobin A1c; Future  Murmur -     ECHOCARDIOGRAM COMPLETE; Future  Age-appropriate health screenings discussed.  Obtain labs.  Advised Pap, colonoscopy, and mammogram no longer indicated for preventative screenings.  Immunizations reviewed.  Consider pneumonia, influenza vaccine, shingles vaccine.  Patient declines at this time.  Murmur appreciated on exam.  Patient asymptomatic.  Per chart review murmur also noted last year during CPE.  At that time patient wished to wait on further evaluation.  Patient now open to obtaining echo to further evaluate.  Referral to cardiology if needed based on results of echo.  Given strict precautions.  Next CPE in 1 year.  AWV due 10/02/2024.  Return in about 3 months (around 11/07/2024), or if symptoms worsen or fail to improve.   Clotilda JONELLE Single, MD

## 2024-08-08 ENCOUNTER — Ambulatory Visit: Payer: Self-pay | Admitting: Family Medicine

## 2024-09-11 ENCOUNTER — Encounter: Payer: Self-pay | Admitting: Family Medicine

## 2024-09-11 ENCOUNTER — Ambulatory Visit: Admitting: Family Medicine

## 2024-09-11 VITALS — BP 136/80 | HR 60 | Temp 97.9°F | Wt 139.0 lb

## 2024-09-11 DIAGNOSIS — R7303 Prediabetes: Secondary | ICD-10-CM

## 2024-09-11 LAB — POCT GLYCOSYLATED HEMOGLOBIN (HGB A1C)
HbA1c POC (<> result, manual entry): 5.8 % (ref 4.0–5.6)
HbA1c, POC (controlled diabetic range): 5.8 % (ref 0.0–7.0)
HbA1c, POC (prediabetic range): 5.8 % (ref 5.7–6.4)
Hemoglobin A1C: 5.8 % — AB (ref 4.0–5.6)

## 2024-09-11 NOTE — Progress Notes (Signed)
 Established Patient Office Visit   Subjective  Patient ID: Sabrina Rivera, female    DOB: Feb 11, 1941  Age: 83 y.o. MRN: [id number]  No chief complaint on file.   Pt is an 83 yo female seen for f/u on elevated A1C.  A1C at last OFV on 08/07/24, 6.5%.  Since hearing the result pt stopped having ice cream nightly and has cut down on sweet.  No prior h/o DM.    Patient Active Problem List   Diagnosis Date Noted   Mixed hyperlipidemia 09/01/2020   Statin declined 09/01/2020   Past Medical History:  Diagnosis Date   Hyperlipidemia    Internal hemorrhoids    Statin declined 09/01/2020   Type 2 diabetes mellitus (HCC)    Vitamin D  deficiency    Past Surgical History:  Procedure Laterality Date   DG RIGHT TIBIA AND FIBULA ([facility] HX)  1995   broke leg roller skating   TONSILLECTOMY Bilateral in second grade   Social History   Tobacco Use   Smoking status: Never   Smokeless tobacco: Never  Vaping Use   Vaping status: Never Used  Substance Use Topics   Alcohol use: Never    Alcohol/week: 0.0 standard drinks of alcohol   Drug use: Never   Family History  Problem Relation Age of Onset   Heart disease Brother    Hypertension Father    Lung cancer Father    Stroke Mother    Hypertension Mother    Thyroid  disease Mother    Allergies  Allergen Reactions   Morphine And Codeine Nausea And Vomiting    ROS Negative unless stated above    Objective:     There were no vitals taken for this visit. BP Readings from Last 3 Encounters:  08/07/24 134/62  08/03/23 136/84  03/13/23 129/79   Wt Readings from Last 3 Encounters:  08/07/24 140 lb 6.4 oz (63.7 kg)  10/03/23 131 lb (59.4 kg)  08/03/23 131 lb 3.2 oz (59.5 kg)      Physical Exam Constitutional:      Appearance: Normal appearance.  HENT:     Head: Normocephalic and atraumatic.     Mouth/Throat:     Mouth: Mucous membranes are moist.  Cardiovascular:     Pulses: Normal pulses.  Pulmonary:     Effort:  Pulmonary effort is normal.  Skin:    General: Skin is warm and dry.  Neurological:     Mental Status: She is alert and oriented to person, place, and time. Mental status is at baseline.        08/07/2024    1:48 PM 10/03/2023   11:58 AM 08/03/2023   11:03 AM  Depression screen PHQ 2/9  Decreased Interest 0 0 0  Down, Depressed, Hopeless 0 0 0  PHQ - 2 Score 0 0 0  Altered sleeping 0  0  Tired, decreased energy 0  0  Change in appetite 0  0  Feeling bad or failure about yourself  0  0  Trouble concentrating 0  0  Moving slowly or fidgety/restless 0  0  Suicidal thoughts 0  0  PHQ-9 Score 0  0      08/07/2024    1:48 PM 08/03/2023   11:03 AM  GAD 7 : Generalized Anxiety Score  Nervous, Anxious, on Edge 0 0  Control/stop worrying 0 0  Worry too much - different things 0 1  Trouble relaxing 0 0  Restless 0 0  Easily annoyed  or irritable 0 0  Afraid - awful might happen 0 0  Total GAD 7 Score 0 1  Anxiety Difficulty  Not difficult at all     Results for orders placed or performed in visit on 09/11/24  POC HgB A1c  Result Value Ref Range   Hemoglobin A1C 5.8 (A) 4.0 - 5.6 %   HbA1c POC (<> result, manual entry) 5.8 4.0 - 5.6 %   HbA1c, POC (prediabetic range) 5.8 5.7 - 6.4 %   HbA1c, POC (controlled diabetic range) 5.8 0.0 - 7.0 %      Assessment & Plan:   Prediabetes -     POCT glycosylated hemoglobin (Hb A1C)  A1C recheck.  A1C elevated at 6.5% on 08/07/24.  Recheck this visit 5.8%.  continue lifestyle modifications.    Return if symptoms worsen or fail to improve.   Clotilda JONELLE Single, MD

## 2024-10-07 ENCOUNTER — Telehealth: Payer: Self-pay

## 2024-10-07 ENCOUNTER — Telehealth (HOSPITAL_COMMUNITY): Payer: Self-pay | Admitting: Family Medicine

## 2024-10-07 NOTE — Telephone Encounter (Signed)
 Copied from CRM #[id number]. Topic: Clinical - Lab/Test Results >> Oct 07, 2024 10:07 AM Sabrina Rivera wrote: Reason for CRM: Patient would like Recent A1c printed and mailed to her & also stated she would like future results mailed out.

## 2024-10-07 NOTE — Telephone Encounter (Signed)
 Patient cancelled the ordered echocardiogram scheduled for 10/09/24 for reason below:  10/07/2024 10:17 AM Ab:BNL[facility], KELSEY  Cancel Rsn: Patient (Pt will callback to reschedule)   Order will be removed from the active echo WQ and when patient calls back we will reinstate the order. Thank you.

## 2024-10-07 NOTE — Telephone Encounter (Signed)
 A1c results have been mailed

## 2024-10-09 ENCOUNTER — Ambulatory Visit (HOSPITAL_COMMUNITY)

## 2025-01-23 ENCOUNTER — Ambulatory Visit

## 2025-02-25 ENCOUNTER — Ambulatory Visit

## 2025-08-14 ENCOUNTER — Encounter: Admitting: Family Medicine
